# Patient Record
Sex: Male | Born: 1950 | Race: White | Hispanic: No | Marital: Married | Smoking: Never smoker
Health system: Southern US, Community
[De-identification: ages and names within clinical notes are randomized; demographics above are authoritative.]

## PROBLEM LIST (undated history)

## (undated) DIAGNOSIS — R011 Cardiac murmur, unspecified: Secondary | ICD-10-CM

## (undated) DIAGNOSIS — G4700 Insomnia, unspecified: Secondary | ICD-10-CM

## (undated) DIAGNOSIS — Q233 Congenital mitral insufficiency: Secondary | ICD-10-CM

## (undated) DIAGNOSIS — M1990 Unspecified osteoarthritis, unspecified site: Secondary | ICD-10-CM

## (undated) DIAGNOSIS — Z9289 Personal history of other medical treatment: Secondary | ICD-10-CM

## (undated) DIAGNOSIS — G2581 Restless legs syndrome: Secondary | ICD-10-CM

## (undated) DIAGNOSIS — I2510 Atherosclerotic heart disease of native coronary artery without angina pectoris: Secondary | ICD-10-CM

## (undated) DIAGNOSIS — G5600 Carpal tunnel syndrome, unspecified upper limb: Secondary | ICD-10-CM

## (undated) DIAGNOSIS — G4730 Sleep apnea, unspecified: Secondary | ICD-10-CM

## (undated) DIAGNOSIS — I499 Cardiac arrhythmia, unspecified: Secondary | ICD-10-CM

## (undated) DIAGNOSIS — R0600 Dyspnea, unspecified: Secondary | ICD-10-CM

## (undated) DIAGNOSIS — G709 Myoneural disorder, unspecified: Secondary | ICD-10-CM

## (undated) HISTORY — PX: CARDIAC VALVE REPLACEMENT: SHX585

## (undated) HISTORY — PX: ABLATION: SHX5711

## (undated) HISTORY — PX: EYE SURGERY: SHX253

## (undated) HISTORY — PX: KNEE SURGERY: SHX244

## (undated) HISTORY — PX: OTHER SURGICAL HISTORY: SHX169

## (undated) HISTORY — PX: CARPAL TUNNEL RELEASE: SHX101

---

## 2018-10-12 HISTORY — PX: CARDIAC CATHETERIZATION: SHX172

## 2018-12-21 HISTORY — PX: MITRAL VALVE REPLACEMENT: SHX147

## 2019-01-18 HISTORY — PX: OTHER SURGICAL HISTORY: SHX169

## 2019-06-28 ENCOUNTER — Other Ambulatory Visit: Payer: Self-pay | Admitting: Orthopedic Surgery

## 2019-07-15 NOTE — Pre-Procedure Instructions (Signed)
Grant Hayden  07/15/2019      [place], [place] [address] [place]  [address] Phone: [phone] Fax: [phone]    Your procedure is scheduled on Dec.3  Report to [facility] entrance A at 5:30 A.M.  Call this number if you have problems the morning of surgery:  [phone]   Remember:  Do not eat  after midnight.  You may drink clear liquids until 4:30 a.m. [name] Kitchen  Clear liquids allowed are:                    Water, Juice (non-citric and without pulp), Carbonated beverages, Clear Tea, Black Coffee only, Plain Jell-O only, Gatorade and Plain Popsicles only   Please complete your PRE-SURGERY ENSURE that was provided to you by 4:30 A.M. the morning of surgery.  Please, if able, drink it in one setting. DO NOT SIP.    Take these medicines the morning of surgery with A SIP OF WATER :  None            7 days prior to surgery STOP taking any Aspirin (unless otherwise instructed by your surgeon), Aleve, Naproxen, Ibuprofen, Motrin, Advil, Goody's, BC's, all herbal medications, fish oil, and all vitamins.            Follow your surgeon's instructions on when to stop Aspirin.  If no instructions were given by your surgeon then you will need to call the office to get those instructions.                    Do not wear jewelry.  Do not wear lotions, powders, or perfumes, or deodorant.  Do not shave 48 hours prior to surgery.  Men may shave face and neck.  Do not bring valuables to the hospital.  [facility] is not responsible for any belongings or valuables.  Contacts, dentures or bridgework may not be worn into surgery.  Leave your suitcase in the car.  After surgery it may be brought to your room.  For patients admitted to the hospital, discharge time will be determined by your treatment team.  Patients discharged the day of surgery will not be allowed to drive home.    Special instructions:   [facility]- Preparing For Surgery  Before surgery, you can play an important role. Because skin is not sterile, your skin needs to be as free of germs as possible. You can reduce the number of germs on your skin by washing with CHG (chlorahexidine gluconate) Soap before surgery.  CHG is an antiseptic cleaner which kills germs and bonds with the skin to continue killing germs even after washing.    Oral Hygiene is also important to reduce your risk of infection.  Remember - BRUSH YOUR TEETH THE MORNING OF SURGERY WITH YOUR REGULAR TOOTHPASTE  Please do not use if you have an allergy to CHG or antibacterial soaps. If your skin becomes reddened/irritated stop using the CHG.  Do not shave (including legs and underarms) for at least 48 hours prior to first CHG shower. It is OK to shave your face.  Please follow these instructions carefully.   1. Shower the NIGHT BEFORE SURGERY and the MORNING OF SURGERY with CHG.   2. If you chose to wash your hair, wash your hair first as usual with your normal shampoo.  3. After you shampoo, rinse your hair and body thoroughly  to remove the shampoo.  4. Use CHG as you would any other liquid soap. You can apply CHG directly to the skin and wash gently with a scrungie or a clean washcloth.   5. Apply the CHG Soap to your body ONLY FROM THE NECK DOWN.  Do not use on open wounds or open sores. Avoid contact with your eyes, ears, mouth and genitals (private parts). Wash Face and genitals (private parts)  with your normal soap.  6. Wash thoroughly, paying special attention to the area where your surgery will be performed.  7. Thoroughly rinse your body with warm water from the neck down.  8. DO NOT shower/wash with your normal soap after using and rinsing off the CHG Soap.  9. Pat yourself dry with a CLEAN TOWEL.  10. Wear CLEAN PAJAMAS to bed the night before surgery, wear comfortable clothes the morning of surgery  11. Place CLEAN SHEETS on your bed the night of  your first shower and DO NOT SLEEP WITH PETS.    Day of Surgery:  Do not apply any deodorants/lotions.  Please wear clean clothes to the hospital/surgery center.   Remember to brush your teeth WITH YOUR REGULAR TOOTHPASTE.    Please read over the following fact sheets that you were given. Coughing and Deep Breathing and Surgical Site Infection Prevention

## 2019-07-18 ENCOUNTER — Other Ambulatory Visit: Payer: Self-pay

## 2019-07-18 ENCOUNTER — Encounter (HOSPITAL_COMMUNITY)
Admission: RE | Admit: 2019-07-18 | Discharge: 2019-07-18 | Disposition: A | Discharge status: Home / Self Care | Payer: Medicare Other | Source: Ambulatory Visit | Attending: Orthopedic Surgery | Admitting: Orthopedic Surgery

## 2019-07-18 ENCOUNTER — Encounter (HOSPITAL_COMMUNITY): Payer: Self-pay

## 2019-07-18 ENCOUNTER — Other Ambulatory Visit (HOSPITAL_COMMUNITY)
Admission: RE | Admit: 2019-07-18 | Discharge: 2019-07-18 | Disposition: A | Discharge status: Home / Self Care | Payer: Medicare Other | Source: Ambulatory Visit | Attending: Orthopedic Surgery | Admitting: Orthopedic Surgery

## 2019-07-18 DIAGNOSIS — R011 Cardiac murmur, unspecified: Secondary | ICD-10-CM | POA: Diagnosis not present

## 2019-07-18 DIAGNOSIS — G2581 Restless legs syndrome: Secondary | ICD-10-CM | POA: Diagnosis not present

## 2019-07-18 DIAGNOSIS — Z01812 Encounter for preprocedural laboratory examination: Secondary | ICD-10-CM | POA: Insufficient documentation

## 2019-07-18 DIAGNOSIS — D649 Anemia, unspecified: Secondary | ICD-10-CM | POA: Insufficient documentation

## 2019-07-18 DIAGNOSIS — G629 Polyneuropathy, unspecified: Secondary | ICD-10-CM | POA: Diagnosis not present

## 2019-07-18 DIAGNOSIS — G9589 Other specified diseases of spinal cord: Secondary | ICD-10-CM | POA: Diagnosis not present

## 2019-07-18 DIAGNOSIS — Q233 Congenital mitral insufficiency: Secondary | ICD-10-CM | POA: Diagnosis not present

## 2019-07-18 DIAGNOSIS — I4891 Unspecified atrial fibrillation: Secondary | ICD-10-CM | POA: Diagnosis not present

## 2019-07-18 DIAGNOSIS — I4892 Unspecified atrial flutter: Secondary | ICD-10-CM | POA: Insufficient documentation

## 2019-07-18 DIAGNOSIS — Z79899 Other long term (current) drug therapy: Secondary | ICD-10-CM | POA: Diagnosis not present

## 2019-07-18 DIAGNOSIS — Z7982 Long term (current) use of aspirin: Secondary | ICD-10-CM | POA: Insufficient documentation

## 2019-07-18 HISTORY — DX: Myoneural disorder, unspecified: G70.9

## 2019-07-18 HISTORY — DX: Cardiac arrhythmia, unspecified: I49.9

## 2019-07-18 HISTORY — DX: Restless legs syndrome: G25.81

## 2019-07-18 HISTORY — DX: Cardiac murmur, unspecified: R01.1

## 2019-07-18 HISTORY — DX: Personal history of other medical treatment: Z92.89

## 2019-07-18 HISTORY — DX: Congenital mitral insufficiency: Q23.3

## 2019-07-18 HISTORY — DX: Insomnia, unspecified: G47.00

## 2019-07-18 HISTORY — DX: Carpal tunnel syndrome, unspecified upper limb: G56.00

## 2019-07-18 HISTORY — DX: Unspecified osteoarthritis, unspecified site: M19.90

## 2019-07-18 LAB — CBC WITH DIFFERENTIAL/PLATELET
Abs Immature Granulocytes: 0.01 10*3/uL (ref 0.00–0.07)
Basophils Absolute: 0.1 10*3/uL (ref 0.0–0.1)
Basophils Relative: 1 %
Eosinophils Absolute: 0.1 10*3/uL (ref 0.0–0.5)
Eosinophils Relative: 1 %
HCT: 45.1 % (ref 39.0–52.0)
Hemoglobin: 15 g/dL (ref 13.0–17.0)
Immature Granulocytes: 0 %
Lymphocytes Relative: 26 %
Lymphs Abs: 1.9 10*3/uL (ref 0.7–4.0)
MCH: 32.3 pg (ref 26.0–34.0)
MCHC: 33.3 g/dL (ref 30.0–36.0)
MCV: 97 fL (ref 80.0–100.0)
Monocytes Absolute: 0.9 10*3/uL (ref 0.1–1.0)
Monocytes Relative: 12 %
Neutro Abs: 4.5 10*3/uL (ref 1.7–7.7)
Neutrophils Relative %: 60 %
Platelets: 205 10*3/uL (ref 150–400)
RBC: 4.65 MIL/uL (ref 4.22–5.81)
RDW: 13.6 % (ref 11.5–15.5)
WBC: 7.4 10*3/uL (ref 4.0–10.5)
nRBC: 0 % (ref 0.0–0.2)

## 2019-07-18 LAB — COMPREHENSIVE METABOLIC PANEL
ALT: 28 U/L (ref 0–44)
AST: 35 U/L (ref 15–41)
Albumin: 3.9 g/dL (ref 3.5–5.0)
Alkaline Phosphatase: 79 U/L (ref 38–126)
Anion gap: 10 (ref 5–15)
BUN: 18 mg/dL (ref 8–23)
CO2: 25 mmol/L (ref 22–32)
Calcium: 9.7 mg/dL (ref 8.9–10.3)
Chloride: 102 mmol/L (ref 98–111)
Creatinine, Ser: 1.01 mg/dL (ref 0.61–1.24)
GFR calc Af Amer: >60 mL/min (ref >60)
GFR calc non Af Amer: >60 mL/min (ref >60)
Glucose, Bld: 87 mg/dL (ref 70–99)
Potassium: 4.2 mmol/L (ref 3.5–5.1)
Sodium: 137 mmol/L (ref 135–145)
Total Bilirubin: 0.7 mg/dL (ref 0.3–1.2)
Total Protein: 7.1 g/dL (ref 6.5–8.1)

## 2019-07-18 LAB — TYPE AND SCREEN
ABO/RH(D): O POS
Antibody Screen: NEGATIVE

## 2019-07-18 LAB — URINALYSIS, ROUTINE W REFLEX MICROSCOPIC
Bilirubin Urine: NEGATIVE
Glucose, UA: NEGATIVE mg/dL
Hgb urine dipstick: NEGATIVE
Ketones, ur: NEGATIVE mg/dL
Leukocytes,Ua: NEGATIVE
Nitrite: NEGATIVE
Protein, ur: NEGATIVE mg/dL
Specific Gravity, Urine: 1.011 (ref 1.005–1.030)
pH: 5 (ref 5.0–8.0)

## 2019-07-18 LAB — ABO/RH: ABO/RH(D): O POS

## 2019-07-18 LAB — SURGICAL PCR SCREEN
MRSA, PCR: NEGATIVE
Staphylococcus aureus: NEGATIVE

## 2019-07-18 LAB — APTT: aPTT: 42 seconds — ABNORMAL HIGH (ref 24–36)

## 2019-07-18 LAB — PROTIME-INR
INR: 1 (ref 0.8–1.2)
Prothrombin Time: 13.5 seconds (ref 11.4–15.2)

## 2019-07-18 NOTE — Progress Notes (Signed)
Patient denies shortness of breath, fever, cough and chest pain.  PCP - Dawayne Patricia, [facility] Cardiologist - Dr Nancy Fetter Rosaura Carpenter (see OV note 04/20/19)  Chest x-ray - 01/03/19 [facility] EKG - 02/02/19 [facility] Stress Test - denies ECHO - 04/20/19 [facility] Cardiac Cath - 10/12/18 [facility]  Aspirin Instructions: Last Dose 11/25/2  Anesthesia review: Yes  Coronavirus Screening Have you experienced the following symptoms:  Cough yes/no: No Fever (>100.46F)  yes/no: No Runny nose yes/no: No Sore throat yes/no: No Difficulty breathing/shortness of breath  yes/no: No  Have you traveled in the last 14 days and where? yes/no: No  Patient verbalized understanding of instructions that were given to them at the PAT appointment.

## 2019-07-19 ENCOUNTER — Encounter (HOSPITAL_COMMUNITY): Payer: Self-pay

## 2019-07-19 LAB — NOVEL CORONAVIRUS, NAA (HOSP ORDER, SEND-OUT TO REF LAB; TAT 18-24 HRS): SARS-CoV-2, NAA: NOT DETECTED

## 2019-07-19 NOTE — Anesthesia Preprocedure Evaluation (Addendum)
Anesthesia Evaluation  Patient identified by MRN, date of birth, ID band Patient awake    Reviewed: Allergy & Precautions, NPO status , Patient's Chart, lab work & pertinent test results  History of Anesthesia Complications Negative for: history of anesthetic complications  Airway Mallampati: II  TM Distance: >3 FB Neck ROM: Full    Dental no notable dental hx. (+) Dental Advisory Given   Pulmonary neg pulmonary ROS,    Pulmonary exam normal        Cardiovascular Normal cardiovascular exam+ dysrhythmias Atrial Fibrillation + Valvular Problems/Murmurs   S/P MVR  As needed CT surgery follow-up recommended at 02/09/19 visit. Last seen by cardiologist Dr. Maudie Mercury on 04/20/19. Right femoral groin seroma site appeared well healed. Maintaining SR following June DCCV, so Eliquis discontinued (ASA only). Echo showed EF 50-55% with normal bioprosthetic MV function. Toprol added for history mildly depressed LVF, post-op afib, and PVCs. Six month follow-up recommended. Last ASA 07/13/19 in anticipation for surgery.    Neuro/Psych negative psych ROS   GI/Hepatic negative GI ROS, Neg liver ROS,   Endo/Other  negative endocrine ROS  Renal/GU negative Renal ROS     Musculoskeletal negative musculoskeletal ROS (+)   Abdominal   Peds  Hematology negative hematology ROS (+)   Anesthesia Other Findings Day of surgery medications reviewed with the patient.  Reproductive/Obstetrics                            Anesthesia Physical Anesthesia Plan  ASA: III  Anesthesia Plan: General   Post-op Pain Management:    Induction: Intravenous  PONV Risk Score and Plan: 3 and Ondansetron, Dexamethasone and Midazolam  Airway Management Planned: Oral ETT  Additional Equipment:   Intra-op Plan:   Post-operative Plan: Extubation in OR  Informed Consent: I have reviewed the patients History and Physical, chart, labs and  discussed the procedure including the risks, benefits and alternatives for the proposed anesthesia with the patient or authorized representative who has indicated his/her understanding and acceptance.     Dental advisory given  Plan Discussed with: Anesthesiologist and CRNA  Anesthesia Plan Comments: (PAT note written 07/19/2019 by Myra Gianotti, PA-C. )      Anesthesia Quick Evaluation

## 2019-07-19 NOTE — Progress Notes (Signed)
Anesthesia Chart Review:  Case: [id number] Date/Time: 07/21/19 0715   Procedure: ANTERIOR CERVICAL DECOMPRESSION FUSION CERVICAL 5-6, CERVICAL 6-7 WITH INSTRUMENTATION AND ALLOGRAFT (N/A )   Anesthesia type: General   Pre-op diagnosis: PROGRESSIVE CERVICAL MYELOPATHY   Location: [facility] ROOM 05 / [facility] OR   Surgeon: Estill Bamberg, MD      DISCUSSION: Grant Hayden is a 68 year old male scheduled for the above procedure.  History includes never smoker, murmur/severe MR (s/p right mini-thoracotomy MV replacement with 33 mm Magna Ease bioprosthetic valve 12/21/18 with take-back for mediastinal chest exploration with repair of RV pacemaker wire site bleeding; epicardial wires cut at the level of the skin on 01/02/19), post-MVR anemia (s/p PRBC), post-MVR right groin seroma (at cannulation site; s/p I&D and placement of wound VAC 01/18/19), post-MVR afib/flutter (s/p failed DCCV 12/24/18, successful DCCV 01/18/19), RLS, neuropathy/cervical myelopathy (hands/feet).  Normal coronaries by 09/2018 cath.  As needed CT surgery follow-up recommended at 02/09/19 visit. Last seen by cardiologist Dr. Selena Batten on 04/20/19. Right femoral groin seroma site appeared well healed. Maintaining SR following June DCCV, so Eliquis discontinued (ASA only). Echo showed EF 50-55% with normal bioprosthetic MV function. Toprol added for history mildly depressed LVF, post-op afib, and PVCs. Six month follow-up recommended. Last ASA 07/13/19 in anticipation for surgery.   07/18/19 presurgical COVID-19 test in process. If negative and otherwise no acute changes then I would anticipate that he can proceed as planned.   VS: BP 118/89   Pulse 83   Temp 36.8 C (Oral)   Resp 18   Ht 6' (1.829 m)   Wt 81.4 kg   SpO2 100%   BMI 24.34 kg/m    PROVIDERS: Marinell Blight, PA-C is PCP  - Selena Batten Orma Render, MD is cardiologist [facility] Everywhere). Last visit 04/20/19 with six month follow-up planned.  Cain Sieve, MD is CT Surgeon [facility]  Everywhere). Last visit 02/09/19 with PRN follow-up recommended.    LABS: Preoperative labs noted. Results WNL except elevated aPTT at 42. (Comparison aPTT on 01/18/19 and 12/29/18 were WNL at [facility]). I called PTT result to Lupita Leash at Dr. Marshell Levan office and also sent him a message through [facility]. No plans for repeat preoperative PTT unless Dr. Yevette Edwards contacts me and says otherwise. (all labs ordered are listed, but only abnormal results are displayed)  Labs Reviewed  APTT - Abnormal; Notable for the following components:      Result Value   aPTT 42 (*)    All other components within normal limits  SURGICAL PCR SCREEN  CBC WITH DIFFERENTIAL/PLATELET  COMPREHENSIVE METABOLIC PANEL  PROTIME-INR  URINALYSIS, ROUTINE W REFLEX MICROSCOPIC  TYPE AND SCREEN     IMAGES: MRI Brain 06/13/19 ([facility]): Result Impression: Mild intracranial small vessel disease. Negative for acute intracranial pathology.  X-ray C-spine with Flexion/Extension 05/20/19 ([facility]): Result Impression: 1. There is 3.2 mm anterolisthesis at C7-T1 in the neutral position which remains stable with flexion but decreases slightly with extension. 2. Severe disc space narrowing and degenerative endplate osteophytes from C4-5 to C7-T1. 3. Bilateral bony foraminal narrowing as reported above. 4. Mild degenerative changes between the left lateral mass of C1 and C2 corresponding to the level of the effusion seen on MRI.  MRI C-spine 05/17/19 ([facility]): Results Impression: - Multilevel cervical spondylosis and mild spondylolisthesis. - At C6-C7 there is severe spinal canal and severe bilateral neural foraminal narrowing. No abnormal cord signal. - At C5-C6 there is mild spinal canal narrowing and severe bilateral  neural foraminal narrowing. - Other levels of lesser narrowing as above. - Large left atlantooccipital joint effusion suggestive of facet synovitis.   EKG: 03/07/19 [facility]):  Normal sinus  rhythm Left axis deviation RSR prime or QR pattern in V1 suggests right ventricular conduction delay Minimal voltage criteria for LVH, may be normal variant Abnormal ECG When compared with ECG of 10/13/2007: 4 3, premature ventricular complexes are no longer present, T wave inversion no longer evident in inferior leads. - Confirmed by Riccardo Dubin, MD   CV: Echo 04/20/19 ([facility]): Conclusions: Left ventricle: The left ventricular chamber size is mildly dilated. Left ventricular systolic function is at the lower limits of normal. The estimated ejection fraction is 50-55%. This is likely secondary to intraventricular conduction delay and septal dysynchrony status post open heart surgery. There is an E to A reversal in the mitral valve flow pattern suggestive of diastolic dysfunction. Aortic valve: There is trace aortic regurgitation. Mitral valve: There is mild mitral stenosis. Likely within normal limits given mitral valve replacement. There is a trace of mitral regurgitation. The bio-prosthetic mitral valve appears to be functioning normally.  Tricuspid valve: There is a trace tricuspid regurgitation.  Pulmonic valve: There is a trace pulmonic regurgitation.    Cardiac Event Monitor Result 04/13/19 [facility]): According to 04/20/19 note by Dr. Maudie Mercury, "Thirty day ambulatory ECG monitor demonstrated sinus rhythm with PVCs and nonsustained VT (longest 9 beats). Denies palpitations. We will start Grant Hayden on low-dose metoprolol given mildly depressed left ventricular systolic function, history of postoperative atrial flutter/fibrillation, and PVCs."    Cardiac cath 10/12/18 ([facility]): FINDINGS 1. Normal coronary arteries.  RECOMMENDATIONS 1. Continue evaluation for mitral valve prolapse with mitral regurgitation     Past Medical History:  Diagnosis Date  . Arthritis    hands, neck  . Carpal tunnel syndrome    bilateral  . Dysrhythmia    a-fib/a-flutter in post op after  12/21/18 MV replacement surgery, PVCs   . Heart murmur   . History of blood transfusion    [facility]  . Insomnia   . Mitral valve regurgitation congenital   . Neuromuscular disorder (HCC)    neuropathy hands/feet  . RLS (restless legs syndrome)     Past Surgical History:  Procedure Laterality Date  . CARDIAC CATHETERIZATION  10/12/2018  . finger fx Left    surgery  . KNEE SURGERY Right   . MITRAL VALVE REPLACEMENT  12/21/2018   right mini-thoracotomy for MVR using 33 mm Magna Ease bioprosthetic valve, Dr. Dwaine Deter, [facility]; had RV epidcarial pacemaker wire bleed requiring take-back and repair, so epicardial wires cut at level of skin (and not pulled) on 01/02/19  . tee guided cardioversion  01/18/2019    MEDICATIONS: . acetaminophen (TYLENOL) 500 MG tablet  . aspirin EC 81 MG tablet  . Cholecalciferol (VITAMIN D) 50 MCG (2000 UT) CAPS  . pramipexole (MIRAPEX) 1.5 MG tablet  . vitamin B-12 (CYANOCOBALAMIN) 1000 MCG tablet   No current facility-administered medications for this encounter.     Myra Gianotti, PA-C Surgical Short Stay/Anesthesiology [facility] Phone [phone] [facility] Phone [phone] 07/19/2019 2:48 PM

## 2019-07-21 ENCOUNTER — Observation Stay (HOSPITAL_COMMUNITY)
Admission: RE | Admit: 2019-07-21 | Discharge: 2019-07-22 | Disposition: A | Discharge status: Home / Self Care | Payer: Medicare Other | Attending: Orthopedic Surgery | Admitting: Orthopedic Surgery

## 2019-07-21 ENCOUNTER — Ambulatory Visit (HOSPITAL_COMMUNITY): Payer: Medicare Other

## 2019-07-21 ENCOUNTER — Encounter (HOSPITAL_COMMUNITY)
Admission: RE | Disposition: A | Discharge status: Home / Self Care | Payer: Self-pay | Source: Home / Self Care | Attending: Orthopedic Surgery

## 2019-07-21 ENCOUNTER — Ambulatory Visit (HOSPITAL_COMMUNITY): Payer: Medicare Other | Admitting: Vascular Surgery

## 2019-07-21 ENCOUNTER — Ambulatory Visit (HOSPITAL_COMMUNITY): Payer: Medicare Other | Admitting: Certified Registered"

## 2019-07-21 ENCOUNTER — Encounter (HOSPITAL_COMMUNITY): Payer: Self-pay

## 2019-07-21 ENCOUNTER — Other Ambulatory Visit: Payer: Self-pay

## 2019-07-21 DIAGNOSIS — Z7982 Long term (current) use of aspirin: Secondary | ICD-10-CM | POA: Insufficient documentation

## 2019-07-21 DIAGNOSIS — Z952 Presence of prosthetic heart valve: Secondary | ICD-10-CM | POA: Diagnosis not present

## 2019-07-21 DIAGNOSIS — G629 Polyneuropathy, unspecified: Secondary | ICD-10-CM | POA: Diagnosis not present

## 2019-07-21 DIAGNOSIS — M4802 Spinal stenosis, cervical region: Secondary | ICD-10-CM | POA: Diagnosis not present

## 2019-07-21 DIAGNOSIS — M4712 Other spondylosis with myelopathy, cervical region: Secondary | ICD-10-CM | POA: Diagnosis not present

## 2019-07-21 DIAGNOSIS — I4891 Unspecified atrial fibrillation: Secondary | ICD-10-CM | POA: Insufficient documentation

## 2019-07-21 DIAGNOSIS — Z419 Encounter for procedure for purposes other than remedying health state, unspecified: Secondary | ICD-10-CM

## 2019-07-21 DIAGNOSIS — G959 Disease of spinal cord, unspecified: Secondary | ICD-10-CM | POA: Diagnosis present

## 2019-07-21 DIAGNOSIS — M19041 Primary osteoarthritis, right hand: Secondary | ICD-10-CM | POA: Diagnosis not present

## 2019-07-21 DIAGNOSIS — G2581 Restless legs syndrome: Secondary | ICD-10-CM | POA: Diagnosis not present

## 2019-07-21 DIAGNOSIS — M19042 Primary osteoarthritis, left hand: Secondary | ICD-10-CM | POA: Diagnosis not present

## 2019-07-21 DIAGNOSIS — Z950 Presence of cardiac pacemaker: Secondary | ICD-10-CM | POA: Diagnosis not present

## 2019-07-21 DIAGNOSIS — Z79899 Other long term (current) drug therapy: Secondary | ICD-10-CM | POA: Insufficient documentation

## 2019-07-21 HISTORY — PX: ANTERIOR CERVICAL DECOMP/DISCECTOMY FUSION: SHX1161

## 2019-07-21 SURGERY — ANTERIOR CERVICAL DECOMPRESSION/DISCECTOMY FUSION 2 LEVELS
Anesthesia: General

## 2019-07-21 MED ORDER — CELECOXIB 200 MG PO CAPS
400.0000 mg | ORAL_CAPSULE | Freq: Once | ORAL | Status: AC
  Administered 2019-07-21: 06:00:00 400 mg via ORAL

## 2019-07-21 MED ORDER — SODIUM CHLORIDE 0.9 % IV SOLN: 250.0000 mL | INTRAVENOUS | Status: DC

## 2019-07-21 MED ORDER — EPHEDRINE SULFATE-NACL 50-0.9 MG/10ML-% IV SOSY
PREFILLED_SYRINGE | INTRAVENOUS | Status: DC | PRN
  Administered 2019-07-21 (×3): 10 mg via INTRAVENOUS

## 2019-07-21 MED ORDER — PANTOPRAZOLE SODIUM 40 MG IV SOLR: 40.0000 mg | Freq: Every day | INTRAVENOUS | Status: DC

## 2019-07-21 MED ORDER — PROMETHAZINE HCL 25 MG/ML IJ SOLN: 6.2500 mg | INTRAMUSCULAR | Status: DC | PRN

## 2019-07-21 MED ORDER — ROCURONIUM BROMIDE 10 MG/ML (PF) SYRINGE
PREFILLED_SYRINGE | INTRAVENOUS | Status: AC
  Filled 2019-07-21: qty 10

## 2019-07-21 MED ORDER — SODIUM CHLORIDE 0.9% FLUSH: 3.0000 mL | INTRAVENOUS | Status: DC | PRN

## 2019-07-21 MED ORDER — 0.9 % SODIUM CHLORIDE (POUR BTL) OPTIME
TOPICAL | Status: DC | PRN
  Administered 2019-07-21: 1000 mL

## 2019-07-21 MED ORDER — PHENYLEPHRINE HCL (PRESSORS) 10 MG/ML IV SOLN
INTRAVENOUS | Status: AC
  Filled 2019-07-21: qty 1

## 2019-07-21 MED ORDER — PROPOFOL 10 MG/ML IV BOLUS
INTRAVENOUS | Status: DC | PRN
  Administered 2019-07-21: 190 mg via INTRAVENOUS

## 2019-07-21 MED ORDER — ONDANSETRON HCL 4 MG/2ML IJ SOLN: 4.0000 mg | Freq: Four times a day (QID) | INTRAMUSCULAR | Status: DC | PRN

## 2019-07-21 MED ORDER — ACETAMINOPHEN 325 MG PO TABS: 650.0000 mg | ORAL_TABLET | ORAL | Status: DC | PRN

## 2019-07-21 MED ORDER — PRAMIPEXOLE DIHYDROCHLORIDE 0.25 MG PO TABS
1.5000 mg | ORAL_TABLET | Freq: Every day | ORAL | Status: DC
  Administered 2019-07-21: 1.5 mg via ORAL
  Filled 2019-07-21: qty 6

## 2019-07-21 MED ORDER — BUPIVACAINE HCL (PF) 0.25 % IJ SOLN
INTRAMUSCULAR | Status: AC
  Filled 2019-07-21: qty 30

## 2019-07-21 MED ORDER — MIDAZOLAM HCL 2 MG/2ML IJ SOLN
INTRAMUSCULAR | Status: DC | PRN
  Administered 2019-07-21: 2 mg via INTRAVENOUS

## 2019-07-21 MED ORDER — DEXAMETHASONE SODIUM PHOSPHATE 10 MG/ML IJ SOLN
INTRAMUSCULAR | Status: AC
  Filled 2019-07-21: qty 1

## 2019-07-21 MED ORDER — ACETAMINOPHEN 650 MG RE SUPP: 650.0000 mg | RECTAL | Status: DC | PRN

## 2019-07-21 MED ORDER — CEFAZOLIN SODIUM-DEXTROSE 2-4 GM/100ML-% IV SOLN
INTRAVENOUS | Status: AC
  Filled 2019-07-21: qty 100

## 2019-07-21 MED ORDER — LACTATED RINGERS IV SOLN
INTRAVENOUS | Status: DC | PRN
  Administered 2019-07-21 (×2): via INTRAVENOUS

## 2019-07-21 MED ORDER — MENTHOL 3 MG MT LOZG
1.0000 | LOZENGE | OROMUCOSAL | Status: DC | PRN
  Filled 2019-07-21: qty 9

## 2019-07-21 MED ORDER — SUCCINYLCHOLINE CHLORIDE 200 MG/10ML IV SOSY
PREFILLED_SYRINGE | INTRAVENOUS | Status: AC
  Filled 2019-07-21: qty 10

## 2019-07-21 MED ORDER — POVIDONE-IODINE 7.5 % EX SOLN
Freq: Once | CUTANEOUS | Status: DC
  Filled 2019-07-21: qty 118

## 2019-07-21 MED ORDER — THROMBIN 20000 UNITS EX SOLR
CUTANEOUS | Status: DC | PRN
  Administered 2019-07-21: 20000 [IU] via TOPICAL

## 2019-07-21 MED ORDER — ACETAMINOPHEN 500 MG PO TABS
1000.0000 mg | ORAL_TABLET | Freq: Once | ORAL | Status: AC
  Administered 2019-07-21: 06:00:00 1000 mg via ORAL

## 2019-07-21 MED ORDER — PHENYLEPHRINE HCL-NACL 10-0.9 MG/250ML-% IV SOLN
INTRAVENOUS | Status: DC | PRN
  Administered 2019-07-21: 30 ug/min via INTRAVENOUS

## 2019-07-21 MED ORDER — EPHEDRINE 5 MG/ML INJ
INTRAVENOUS | Status: AC
  Filled 2019-07-21: qty 10

## 2019-07-21 MED ORDER — CEFAZOLIN SODIUM-DEXTROSE 2-4 GM/100ML-% IV SOLN
2.0000 g | Freq: Three times a day (TID) | INTRAVENOUS | Status: AC
  Administered 2019-07-21 (×2): 2 g via INTRAVENOUS
  Filled 2019-07-21 (×2): qty 100

## 2019-07-21 MED ORDER — LIDOCAINE 2% (20 MG/ML) 5 ML SYRINGE
INTRAMUSCULAR | Status: DC | PRN
  Administered 2019-07-21: 100 mg via INTRAVENOUS

## 2019-07-21 MED ORDER — BUPIVACAINE HCL (PF) 0.25 % IJ SOLN
INTRAMUSCULAR | Status: DC | PRN
  Administered 2019-07-21: 5 mL

## 2019-07-21 MED ORDER — ONDANSETRON HCL 4 MG/2ML IJ SOLN
INTRAMUSCULAR | Status: AC
  Filled 2019-07-21: qty 2

## 2019-07-21 MED ORDER — VITAMIN D 25 MCG (1000 UNIT) PO TABS
2000.0000 [IU] | ORAL_TABLET | Freq: Every day | ORAL | Status: DC
  Administered 2019-07-21: 2000 [IU] via ORAL
  Filled 2019-07-21: qty 2

## 2019-07-21 MED ORDER — BISACODYL 5 MG PO TBEC: 5.0000 mg | DELAYED_RELEASE_TABLET | Freq: Every day | ORAL | Status: DC | PRN

## 2019-07-21 MED ORDER — LIDOCAINE 2% (20 MG/ML) 5 ML SYRINGE
INTRAMUSCULAR | Status: AC
  Filled 2019-07-21: qty 5

## 2019-07-21 MED ORDER — ACETAMINOPHEN 500 MG PO TABS
ORAL_TABLET | ORAL | Status: AC
  Administered 2019-07-21: 1000 mg via ORAL
  Filled 2019-07-21: qty 2

## 2019-07-21 MED ORDER — PHENOL 1.4 % MT LIQD: 1.0000 | OROMUCOSAL | Status: DC | PRN

## 2019-07-21 MED ORDER — PROPOFOL 10 MG/ML IV BOLUS
INTRAVENOUS | Status: AC
  Filled 2019-07-21: qty 20

## 2019-07-21 MED ORDER — FLEET ENEMA 7-19 GM/118ML RE ENEM: 1.0000 | ENEMA | Freq: Once | RECTAL | Status: DC | PRN

## 2019-07-21 MED ORDER — PHENYLEPHRINE 40 MCG/ML (10ML) SYRINGE FOR IV PUSH (FOR BLOOD PRESSURE SUPPORT)
PREFILLED_SYRINGE | INTRAVENOUS | Status: AC
  Filled 2019-07-21: qty 10

## 2019-07-21 MED ORDER — MIDAZOLAM HCL 2 MG/2ML IJ SOLN
INTRAMUSCULAR | Status: AC
  Filled 2019-07-21: qty 2

## 2019-07-21 MED ORDER — PHENYLEPHRINE 40 MCG/ML (10ML) SYRINGE FOR IV PUSH (FOR BLOOD PRESSURE SUPPORT)
PREFILLED_SYRINGE | INTRAVENOUS | Status: DC | PRN
  Administered 2019-07-21 (×2): 80 ug via INTRAVENOUS

## 2019-07-21 MED ORDER — ROCURONIUM BROMIDE 10 MG/ML (PF) SYRINGE
PREFILLED_SYRINGE | INTRAVENOUS | Status: DC | PRN
  Administered 2019-07-21: 20 mg via INTRAVENOUS
  Administered 2019-07-21: 80 mg via INTRAVENOUS

## 2019-07-21 MED ORDER — SENNOSIDES-DOCUSATE SODIUM 8.6-50 MG PO TABS: 1.0000 | ORAL_TABLET | Freq: Every evening | ORAL | Status: DC | PRN

## 2019-07-21 MED ORDER — CELECOXIB 200 MG PO CAPS
ORAL_CAPSULE | ORAL | Status: AC
  Administered 2019-07-21: 400 mg via ORAL
  Filled 2019-07-21: qty 2

## 2019-07-21 MED ORDER — OXYCODONE-ACETAMINOPHEN 5-325 MG PO TABS
1.0000 | ORAL_TABLET | ORAL | Status: DC | PRN
  Administered 2019-07-21 – 2019-07-22 (×3): 1 via ORAL
  Filled 2019-07-21 (×3): qty 1

## 2019-07-21 MED ORDER — FENTANYL CITRATE (PF) 250 MCG/5ML IJ SOLN
INTRAMUSCULAR | Status: AC
  Filled 2019-07-21: qty 5

## 2019-07-21 MED ORDER — FENTANYL CITRATE (PF) 250 MCG/5ML IJ SOLN
INTRAMUSCULAR | Status: DC | PRN
  Administered 2019-07-21: 50 ug via INTRAVENOUS
  Administered 2019-07-21: 100 ug via INTRAVENOUS

## 2019-07-21 MED ORDER — CEFAZOLIN SODIUM-DEXTROSE 2-4 GM/100ML-% IV SOLN
2.0000 g | INTRAVENOUS | Status: AC
  Administered 2019-07-21: 2 g via INTRAVENOUS

## 2019-07-21 MED ORDER — ONDANSETRON HCL 4 MG PO TABS: 4.0000 mg | ORAL_TABLET | Freq: Four times a day (QID) | ORAL | Status: DC | PRN

## 2019-07-21 MED ORDER — SODIUM CHLORIDE 0.9% FLUSH
3.0000 mL | Freq: Two times a day (BID) | INTRAVENOUS | Status: DC
  Administered 2019-07-21: 3 mL via INTRAVENOUS

## 2019-07-21 MED ORDER — FENTANYL CITRATE (PF) 100 MCG/2ML IJ SOLN: 25.0000 ug | INTRAMUSCULAR | Status: DC | PRN

## 2019-07-21 MED ORDER — SUGAMMADEX SODIUM 200 MG/2ML IV SOLN
INTRAVENOUS | Status: DC | PRN
  Administered 2019-07-21: 200 mg via INTRAVENOUS

## 2019-07-21 MED ORDER — THROMBIN (RECOMBINANT) 20000 UNITS EX SOLR
CUTANEOUS | Status: AC
  Filled 2019-07-21: qty 20000

## 2019-07-21 MED ORDER — PANTOPRAZOLE SODIUM 40 MG PO TBEC
40.0000 mg | DELAYED_RELEASE_TABLET | Freq: Every day | ORAL | Status: DC
  Administered 2019-07-21: 40 mg via ORAL
  Filled 2019-07-21: qty 1

## 2019-07-21 MED ORDER — DOCUSATE SODIUM 100 MG PO CAPS
100.0000 mg | ORAL_CAPSULE | Freq: Two times a day (BID) | ORAL | Status: DC
  Administered 2019-07-21 (×2): 100 mg via ORAL
  Filled 2019-07-21 (×2): qty 1

## 2019-07-21 MED ORDER — ONDANSETRON HCL 4 MG/2ML IJ SOLN
INTRAMUSCULAR | Status: DC | PRN
  Administered 2019-07-21: 4 mg via INTRAVENOUS

## 2019-07-21 MED ORDER — METHOCARBAMOL 1000 MG/10ML IJ SOLN: 500.0000 mg | Freq: Four times a day (QID) | INTRAVENOUS | Status: DC | PRN

## 2019-07-21 MED ORDER — METHOCARBAMOL 500 MG PO TABS
500.0000 mg | ORAL_TABLET | Freq: Four times a day (QID) | ORAL | Status: DC | PRN
  Administered 2019-07-21: 500 mg via ORAL
  Filled 2019-07-21: qty 1

## 2019-07-21 MED ORDER — ALUM & MAG HYDROXIDE-SIMETH 200-200-20 MG/5ML PO SUSP: 30.0000 mL | Freq: Four times a day (QID) | ORAL | Status: DC | PRN

## 2019-07-21 SURGICAL SUPPLY — 78 items
BENZOIN TINCTURE PRP APPL 2/3 (GAUZE/BANDAGES/DRESSINGS) ×3 IMPLANT
BIT DRILL NEURO 2X3.1 SFT TUCH (MISCELLANEOUS) ×1 IMPLANT
BIT DRILL SKYLINE 12MM (BIT) ×1 IMPLANT
BLADE CLIPPER SURG (BLADE) ×3 IMPLANT
BLADE SURG 15 STRL LF DISP TIS (BLADE) ×1 IMPLANT
BLADE SURG 15 STRL SS (BLADE) ×2
BONE VIVIGEN FORMABLE 1.3CC (Bone Implant) ×6 IMPLANT
BUR MATCHSTICK NEURO 3.0 LAGG (BURR) ×3 IMPLANT
CARTRIDGE OIL MAESTRO DRILL (MISCELLANEOUS) ×1 IMPLANT
CLOSURE STERI-STRIP 1/2X4 (GAUZE/BANDAGES/DRESSINGS) ×1
CLOSURE WOUND 1/2 X4 (GAUZE/BANDAGES/DRESSINGS) ×1
CLSR STERI-STRIP ANTIMIC 1/2X4 (GAUZE/BANDAGES/DRESSINGS) ×2 IMPLANT
COLLAR CERV LO CONTOUR FIRM DE (SOFTGOODS) IMPLANT
CORD BIPOLAR FORCEPS 12FT (ELECTRODE) ×3 IMPLANT
COVER SURGICAL LIGHT HANDLE (MISCELLANEOUS) ×3 IMPLANT
COVER WAND RF STERILE (DRAPES) ×3 IMPLANT
DECANTER SPIKE VIAL GLASS SM (MISCELLANEOUS) IMPLANT
DEVICE ENDSKLTN IMPLNT MED 5MM (Endomechanicals) ×1 IMPLANT
DEVICE ENDSKLTN IMPLNT MED 6MM (Orthopedic Implant) ×1 IMPLANT
DIFFUSER DRILL AIR PNEUMATIC (MISCELLANEOUS) ×3 IMPLANT
DRAIN JACKSON RD 7FR 3/32 (WOUND CARE) IMPLANT
DRAPE C-ARM 42X72 X-RAY (DRAPES) ×3 IMPLANT
DRAPE POUCH INSTRU U-SHP 10X18 (DRAPES) ×3 IMPLANT
DRAPE SURG 17X23 STRL (DRAPES) ×12 IMPLANT
DRILL BIT SKYLINE 12MM (BIT) ×2
DRILL NEURO 2X3.1 SOFT TOUCH (MISCELLANEOUS) ×3
DURAPREP 26ML APPLICATOR (WOUND CARE) ×3 IMPLANT
ELECT COATED BLADE 2.86 ST (ELECTRODE) ×3 IMPLANT
ELECT REM PT RETURN 9FT ADLT (ELECTROSURGICAL) ×3
ELECTRODE REM PT RTRN 9FT ADLT (ELECTROSURGICAL) ×1 IMPLANT
ENDOSKELETON IMPLANT MED 5MM (Endomechanicals) ×3 IMPLANT
ENDOSKELETON IMPLANT MED 6MM (Orthopedic Implant) ×3 IMPLANT
EVACUATOR SILICONE 100CC (DRAIN) IMPLANT
GAUZE 4X4 16PLY RFD (DISPOSABLE) ×3 IMPLANT
GAUZE SPONGE 4X4 12PLY STRL (GAUZE/BANDAGES/DRESSINGS) ×3 IMPLANT
GLOVE BIO SURGEON STRL SZ7 (GLOVE) ×3 IMPLANT
GLOVE BIO SURGEON STRL SZ8 (GLOVE) ×3 IMPLANT
GLOVE BIOGEL PI IND STRL 7.0 (GLOVE) ×2 IMPLANT
GLOVE BIOGEL PI IND STRL 8 (GLOVE) ×1 IMPLANT
GLOVE BIOGEL PI INDICATOR 7.0 (GLOVE) ×4
GLOVE BIOGEL PI INDICATOR 8 (GLOVE) ×2
GOWN STRL REUS W/ TWL LRG LVL3 (GOWN DISPOSABLE) ×1 IMPLANT
GOWN STRL REUS W/ TWL XL LVL3 (GOWN DISPOSABLE) ×1 IMPLANT
GOWN STRL REUS W/TWL LRG LVL3 (GOWN DISPOSABLE) ×2
GOWN STRL REUS W/TWL XL LVL3 (GOWN DISPOSABLE) ×2
IV CATH 14GX2 1/4 (CATHETERS) ×3 IMPLANT
KIT BASIN OR (CUSTOM PROCEDURE TRAY) ×3 IMPLANT
KIT TURNOVER KIT B (KITS) ×3 IMPLANT
MANIFOLD NEPTUNE II (INSTRUMENTS) ×3 IMPLANT
NEEDLE PRECISIONGLIDE 27X1.5 (NEEDLE) ×3 IMPLANT
NEEDLE SPNL 20GX3.5 QUINCKE YW (NEEDLE) ×3 IMPLANT
NS IRRIG 1000ML POUR BTL (IV SOLUTION) ×3 IMPLANT
OIL CARTRIDGE MAESTRO DRILL (MISCELLANEOUS) ×3
PACK ORTHO CERVICAL (CUSTOM PROCEDURE TRAY) IMPLANT
PAD ARMBOARD 7.5X6 YLW CONV (MISCELLANEOUS) ×6 IMPLANT
PATTIES SURGICAL .5 X.5 (GAUZE/BANDAGES/DRESSINGS) IMPLANT
PATTIES SURGICAL .5 X1 (DISPOSABLE) ×3 IMPLANT
PIN DISTRACTION 14 (PIN) ×6 IMPLANT
PLATE SKYLINE 2LVL 26MM (Plate) ×3 IMPLANT
POSITIONER HEAD DONUT 9IN (MISCELLANEOUS) ×3 IMPLANT
SCREW SKYLINE VAR OS 14MM (Screw) ×18 IMPLANT
SPONGE INTESTINAL PEANUT (DISPOSABLE) ×3 IMPLANT
SPONGE SURGIFOAM ABS GEL 100 (HEMOSTASIS) ×3 IMPLANT
STRIP CLOSURE SKIN 1/2X4 (GAUZE/BANDAGES/DRESSINGS) ×2 IMPLANT
SURGIFLO W/THROMBIN 8M KIT (HEMOSTASIS) IMPLANT
SUT MNCRL AB 4-0 PS2 18 (SUTURE) ×3 IMPLANT
SUT SILK 4 0 (SUTURE)
SUT SILK 4-0 18XBRD TIE 12 (SUTURE) IMPLANT
SUT VIC AB 2-0 CT2 18 VCP726D (SUTURE) ×3 IMPLANT
SYR BULB IRRIGATION 50ML (SYRINGE) ×3 IMPLANT
SYR CONTROL 10ML LL (SYRINGE) ×9 IMPLANT
TAPE CLOTH 4X10 WHT NS (GAUZE/BANDAGES/DRESSINGS) IMPLANT
TAPE CLOTH SURG 4X10 WHT LF (GAUZE/BANDAGES/DRESSINGS) ×3 IMPLANT
TAPE UMBILICAL COTTON 1/8X30 (MISCELLANEOUS) ×3 IMPLANT
TOWEL GREEN STERILE (TOWEL DISPOSABLE) ×3 IMPLANT
TOWEL GREEN STERILE FF (TOWEL DISPOSABLE) ×3 IMPLANT
WATER STERILE IRR 1000ML POUR (IV SOLUTION) ×3 IMPLANT
YANKAUER SUCT BULB TIP NO VENT (SUCTIONS) ×3 IMPLANT

## 2019-07-21 NOTE — Anesthesia Postprocedure Evaluation (Signed)
Anesthesia Post Note  Patient: Grant Hayden  Procedure(s) Performed: ANTERIOR CERVICAL DECOMPRESSION FUSION CERVICAL 5-6, CERVICAL 6-7 WITH INSTRUMENTATION AND ALLOGRAFT (N/A )     Patient location during evaluation: PACU Anesthesia Type: General Level of consciousness: sedated Pain management: pain level controlled Vital Signs Assessment: post-procedure vital signs reviewed and stable Respiratory status: spontaneous breathing and respiratory function stable Cardiovascular status: stable Postop Assessment: no apparent nausea or vomiting Anesthetic complications: no    Last Vitals:  Vitals:   07/21/19 1109 07/21/19 1126  BP: 104/74 120/80  Pulse: 78 78  Resp: (!) 23 18  Temp: 36.7 C 36.5 C  SpO2: 95% 99%    Last Pain:  Vitals:   07/21/19 1130  TempSrc:   PainSc: 0-No pain                 [name] DANIEL

## 2019-07-21 NOTE — H&P (Signed)
PREOPERATIVE H&P  Chief Complaint: Progressive balance deterioration  HPI: Grant Hayden is a 68 y.o. male who presents with ongoing deterioration in balance and deterioration in fine motor skills  MRI reveals cord compression and NF stenosis spanning C5-C7  Patient has failed multiple forms of conservative care and continues to have pain (see office notes for additional details regarding the patient's full course of treatment)  Past Medical History:  Diagnosis Date  . Arthritis    hands, neck  . Carpal tunnel syndrome    bilateral  . Dysrhythmia    a-fib/a-flutter in post op after 12/21/18 MV replacement surgery, PVCs   . Heart murmur   . History of blood transfusion    [facility]  . Insomnia   . Mitral valve regurgitation congenital   . Neuromuscular disorder (HCC)    neuropathy hands/feet  . RLS (restless legs syndrome)    Past Surgical History:  Procedure Laterality Date  . CARDIAC CATHETERIZATION  10/12/2018  . finger fx Left    surgery  . KNEE SURGERY Right   . MITRAL VALVE REPLACEMENT  12/21/2018   right mini-thoracotomy for MVR using 33 mm Magna Ease bioprosthetic valve, Dr. Dwaine Deter, [facility]; had RV epidcarial pacemaker wire bleed requiring take-back and repair, so epicardial wires cut at level of skin (and not pulled) on 01/02/19  . tee guided cardioversion  01/18/2019   Social History   Socioeconomic History  . Marital status: Married    Spouse name: Not on file  . Number of children: Not on file  . Years of education: Not on file  . Highest education level: Not on file  Occupational History  . Not on file  Social Needs  . Financial resource strain: Not on file  . Food insecurity    Worry: Not on file    Inability: Not on file  . Transportation needs    Medical: Not on file    Non-medical: Not on file  Tobacco Use  . Smoking status: Never Smoker  . Smokeless tobacco: Never Used  Substance and Sexual Activity  . Alcohol use: Yes   Alcohol/week: 3.0 - 4.0 standard drinks    Types: 3 - 4 Glasses of wine per week  . Drug use: Never  . Sexual activity: Not on file  Lifestyle  . Physical activity    Days per week: Not on file    Minutes per session: Not on file  . Stress: Not on file  Relationships  . Social Herbalist on phone: Not on file    Gets together: Not on file    Attends religious service: Not on file    Active member of club or organization: Not on file    Attends meetings of clubs or organizations: Not on file    Relationship status: Not on file  Other Topics Concern  . Not on file  Social History Narrative  . Not on file   History reviewed. No pertinent family history. No Known Allergies Prior to Admission medications   Medication Sig Start Date End Date Taking? Authorizing Provider  acetaminophen (TYLENOL) 500 MG tablet Take 1,000 mg by mouth at bedtime.   Yes [provider]  aspirin EC 81 MG tablet Take 81 mg by mouth at bedtime.   Yes [provider]  Cholecalciferol (VITAMIN D) 50 MCG (2000 UT) CAPS Take 2,000 Units by mouth daily.   Yes [provider]  pramipexole (MIRAPEX) 1.5 MG tablet Take 1.5  mg by mouth at bedtime.   Yes [provider]  vitamin B-12 (CYANOCOBALAMIN) 1000 MCG tablet Take 1,000 mcg by mouth daily.   Yes [provider]     All other systems have been reviewed and were otherwise negative with the exception of those mentioned in the HPI and as above.  Physical Exam: Vitals:   07/21/19 0601  BP: 119/82  Pulse: 68  Resp: 17  Temp: 97.6 F (36.4 C)  SpO2: 100%    Body mass index is 24.41 kg/m.  General: Alert, no acute distress Cardiovascular: No pedal edema Respiratory: No cyanosis, no use of accessory musculature Skin: No lesions in the area of chief complaint Neurologic: Sensation intact distally Psychiatric: Patient is competent for consent with normal mood and affect Lymphatic: No axillary or  cervical lymphadenopathy  MUSCULOSKELETAL: + hoffman's on the left  Assessment/Plan: PROGRESSIVE CERVICAL MYELOPATHY Plan for Procedure(s): ANTERIOR CERVICAL DECOMPRESSION FUSION CERVICAL 5-6, CERVICAL 6-7 WITH INSTRUMENTATION AND ALLOGRAFT   Jackelyn Hoehn, MD 07/21/2019 7:08 AM

## 2019-07-21 NOTE — Transfer of Care (Signed)
Immediate Anesthesia Transfer of Care Note  Patient: Grant Hayden  Procedure(s) Performed: ANTERIOR CERVICAL DECOMPRESSION FUSION CERVICAL 5-6, CERVICAL 6-7 WITH INSTRUMENTATION AND ALLOGRAFT (N/A )  Patient Location: PACU  Anesthesia Type:General  Level of Consciousness: awake, alert  and oriented  Airway & Oxygen Therapy: Patient Spontanous Breathing and Patient connected to face mask oxygen  Post-op Assessment: Report given to RN and Post -op Vital signs reviewed and stable  Post vital signs: Reviewed and stable  Last Vitals:  Vitals Value Taken Time  BP 105/69 07/21/19 1025  Temp    Pulse 75 07/21/19 1025  Resp 18 07/21/19 1025  SpO2 100 % 07/21/19 1025  Vitals shown include unvalidated device data.  Last Pain:  Vitals:   07/21/19 0613  TempSrc:   PainSc: 0-No pain         Complications: No apparent anesthesia complications

## 2019-07-21 NOTE — Anesthesia Procedure Notes (Signed)
Procedure Name: Intubation Date/Time: 07/21/2019 7:47 AM Performed by: Teressa Lower., CRNA Pre-anesthesia Checklist: Patient identified, Emergency Drugs available, Suction available and Patient being monitored Patient Re-evaluated:Patient Re-evaluated prior to induction Oxygen Delivery Method: Circle system utilized Preoxygenation: Pre-oxygenation with 100% oxygen Induction Type: IV induction Ventilation: Mask ventilation without difficulty Laryngoscope Size: Mac and 4 Tube type: Oral Tube size: 7.5 mm Number of attempts: 1 Airway Equipment and Method: Stylet Placement Confirmation: ETT inserted through vocal cords under direct vision,  positive ETCO2 and breath sounds checked- equal and bilateral Secured at: 24 cm Tube secured with: Tape Dental Injury: Teeth and Oropharynx as per pre-operative assessment

## 2019-07-21 NOTE — Op Note (Signed)
PATIENT NAME: Grant Hayden   MEDICAL RECORD NO.:   [id number]    DATE OF BIRTH: Jul 20, 1951    DATE OF PROCEDURE: 07/21/2019                               OPERATIVE REPORT     PREOPERATIVE DIAGNOSES: 1. Cervical myelopathy with possible cervical radiculopathy 2. Spinal stenosis spanning C5-C7, including spinal cord compression and neuroforaminal stenosis   POSTOPERATIVE DIAGNOSES: 1. Cervical myelopathy with possible cervical radiculopathy 2. Spinal stenosis spanning C5-C7, including spinal cord compression and neuroforaminal stenosis   PROCEDURE: 1. Anterior cervical decompression and fusion C5/6, C6/7. 2. Placement of anterior instrumentation, C5-C7. 3. Insertion of interbody device x 2 (Titan intervertebral spacers). 4. Intraoperative use of fluoroscopy. 5. Use of morselized allograft - ViviGen.   SURGEON:  Estill Bamberg, MD   ASSISTANT:  Jason Coop, PA-C.   ANESTHESIA:  General endotracheal anesthesia.   COMPLICATIONS:  None.   DISPOSITION:  Stable.   ESTIMATED BLOOD LOSS:  Minimal.   INDICATIONS FOR SURGERY:  Briefly, [name] is a pleasant 68 y.o. year- old patient, who did present to me with symptoms consistent with progressive cervical myelopathy.  He also has symptoms into his arms and hands consistent with possible radiculopathy. The patient's MRI did reveal the findings noted above.  Given the patient's ongoing and progressive symptoms, we did discuss proceeding with the procedure noted above.  The patient was fully aware of the risks and limitations of surgery as outlined in my preoperative note.   OPERATIVE DETAILS:  On 07/21/2019, the patient was brought to surgery and general endotracheal anesthesia was administered.  The patient was placed supine on the hospital bed. The neck was gently extended.  All bony prominences were meticulously padded.  The neck was prepped and draped in the usual sterile fashion.  At this point, I did make a left-sided  transverse incision.  The platysma was incised.  A Smith-Robinson approach was used and the anterior spine was identified. Extensive osteophytes were noted, and were removed.  A self-retaining retractor was placed.  I then subperiosteally exposed the vertebral bodies from C5-C7.  Caspar pins were then placed into the C6 and C7 vertebral bodies and distraction was applied.  A thorough and complete C6-7 intervertebral diskectomy was performed.  The posterior longitudinal ligament was identified and entered using a nerve hook.  I then used #1 followed by #2 Kerrison to perform a thorough and complete intervertebral diskectomy.  The spinal canal was thoroughly decompressed, as was the right and left neuroforamen.  Of note, the osteophytes posteriorly were extensive, and did require meticulous utilization of a series of curettes and a high-speed bur.  I was ultimately very pleased with the decompression. The endplates were then prepared and the appropriate-sized intervertebral spacer was then packed with ViviGen and tamped into position in the usual fashion.  The lower Caspar pin was then removed and placed into the C5 vertebral body and once again, distraction was applied across the C5-6 intervertebral space.  I then again performed a thorough and complete diskectomy, thoroughly decompressing the spinal canal and bilateral neuroforamena.   As noted at C6-7, the endplate was very irregular, with extensive osteophytes noted in the region of the spinal canal and right and left foramena.  I was pleased with the decompression at this level as well. After preparing the endplates, the appropriate-sized intervertebral spacer was packed with ViviGen and tamped into position.  The Caspar pins then were removed and bone wax was placed in their place.  The appropriate-sized anterior cervical plate was placed over the anterior spine.   Placement of the plate was rather meticulous, given the prominent osteophytes  noted at the margins of the intervertebral space, particularly lateral osteophytes just beneath the longus coli musculature.  I was however able to place the plate in a safe and appropriate location.  At this point, 14 mm variable angle screws were placed, 2 in each vertebral body from C5-C7 for a total of 6 vertebral body screws.  The screws were then locked to the plate using the Cam locking mechanism.  I was very pleased with the final fluoroscopic images.  The wound was then irrigated.  The wound was then explored for any undue bleeding and there was no bleeding noted. The wound was then closed in layers using 2-0 Vicryl, followed by 4-0 Monocryl.  Benzoin and Steri-Strips were applied, followed by sterile dressing.  All instrument counts were correct at the termination of the procedure.   Of note, Pricilla Holm, PA-C, was my assistant throughout surgery, and did aid in retraction, suctioning, and closure from start to finish.       Phylliss Bob, MD

## 2019-07-22 ENCOUNTER — Encounter (HOSPITAL_COMMUNITY): Payer: Self-pay | Admitting: Orthopedic Surgery

## 2019-07-22 DIAGNOSIS — M4712 Other spondylosis with myelopathy, cervical region: Secondary | ICD-10-CM | POA: Diagnosis not present

## 2019-07-22 MED ORDER — METHOCARBAMOL 500 MG PO TABS: 500.0000 mg | ORAL_TABLET | Freq: Four times a day (QID) | ORAL | 2 refills | Status: DC | PRN

## 2019-07-22 MED ORDER — OXYCODONE-ACETAMINOPHEN 5-325 MG PO TABS: 1.0000 | ORAL_TABLET | ORAL | 0 refills | Status: DC | PRN

## 2019-07-22 NOTE — Plan of Care (Signed)
Patient discharged home per MD order. Instructions given to patient and spouse with stated understanding of instructions given. Patient has an appointment with MD in 2 weeks

## 2019-07-22 NOTE — Progress Notes (Signed)
    Patient doing well  Denies arm pain Tolerating PO well Patient reports improvement in arm and leg symptoms   Physical Exam: Vitals:   07/22/19 0505 07/22/19 0520  BP: 121/81   Pulse: 76   Resp: 18   Temp:  98.1 F (36.7 C)  SpO2: 94%     Neck soft/supple Dressing in place NVI  POD #1 s/p ACDF, doing very well  - encourage ambulation - Percocet for pain, Valium for muscle spasms - d/c home today with f/u in 2 weeks

## 2019-08-03 NOTE — Discharge Summary (Signed)
Patient ID: Grant Hayden MRN: [id number] DOB/AGE: Nov 25, 1950 68 y.o.  Admit date: 07/21/2019 Discharge date: 07/22/2019  Admission Diagnoses:  Active Problems:   Myelopathy [facility])   Discharge Diagnoses:  Same  Past Medical History:  Diagnosis Date  . Arthritis    hands, neck  . Carpal tunnel syndrome    bilateral  . Dysrhythmia    a-fib/a-flutter in post op after 12/21/18 MV replacement surgery, PVCs   . Heart murmur   . History of blood transfusion    [facility]  . Insomnia   . Mitral valve regurgitation congenital   . Neuromuscular disorder (HCC)    neuropathy hands/feet  . RLS (restless legs syndrome)     Surgeries: Procedure(s): ANTERIOR CERVICAL DECOMPRESSION FUSION CERVICAL 5-6, CERVICAL 6-7 WITH INSTRUMENTATION AND ALLOGRAFT on 07/21/2019   Consultants: None  Discharged Condition: Improved  Hospital Course: Grant Hayden is an 68 y.o. male who was admitted 07/21/2019 for operative treatment of myelopathy. Patient has severe unremitting pain that affects sleep, daily activities, and work/hobbies. After pre-op clearance the patient was taken to the operating room on 07/21/2019 and underwent  Procedure(s): ANTERIOR CERVICAL DECOMPRESSION FUSION CERVICAL 5-6, CERVICAL 6-7 WITH INSTRUMENTATION AND ALLOGRAFT.    Patient was given perioperative antibiotics:  Anti-infectives (From admission, onward)   Start     Dose/Rate Route Frequency Ordered Stop   07/21/19 1400  ceFAZolin (ANCEF) IVPB 2g/100 mL premix     2 g 200 mL/hr over 30 Minutes Intravenous Every 8 hours 07/21/19 1131 07/21/19 2140   07/21/19 0600  ceFAZolin (ANCEF) IVPB 2g/100 mL premix     2 g 200 mL/hr over 30 Minutes Intravenous On call to O.R. 07/21/19 6789 07/21/19 0744       Patient was given sequential compression devices, early ambulation to prevent DVT.  Patient benefited maximally from hospital stay and there were no complications.    Recent vital signs: BP 121/72 (BP Location: Right Arm)    Pulse 85   Temp 98 F (36.7 C)   Resp 16   Ht 6' (1.829 m)   Wt 81.6 kg   SpO2 98%   BMI 24.41 kg/m    Discharge Medications:   Allergies as of 07/22/2019   No Known Allergies     Medication List    STOP taking these medications   acetaminophen 500 MG tablet Commonly known as: TYLENOL     TAKE these medications   methocarbamol 500 MG tablet Commonly known as: ROBAXIN Take 1 tablet (500 mg total) by mouth every 6 (six) hours as needed for muscle spasms.   oxyCODONE-acetaminophen 5-325 MG tablet Commonly known as: PERCOCET/ROXICET Take 1-2 tablets by mouth every 4 (four) hours as needed for moderate pain or severe pain.   pramipexole 1.5 MG tablet Commonly known as: MIRAPEX Take 1.5 mg by mouth at bedtime.   vitamin B-12 1000 MCG tablet Commonly known as: CYANOCOBALAMIN Take 1,000 mcg by mouth daily.   Vitamin D 50 MCG (2000 UT) Caps Take 2,000 Units by mouth daily.       Diagnostic Studies: DG Cervical Spine 1 View  Result Date: 07/21/2019 CLINICAL DATA:  Cervical spine surgery. EXAM: DG C-ARM 1-60 MIN; DG CERVICAL SPINE - 1 VIEW CONTRAST:  None. FLUOROSCOPY TIME:  Fluoroscopy Time:  0 minutes 10 seconds Number of Acquired Spot Images: 2 COMPARISON:  No prior. FINDINGS: C5 through C7 anterior and interbody fusion. Anatomic alignment. Visualized hardware intact. Diffuse degenerative change. IMPRESSION: C5 through C7 anterior and interbody fusion. Electronically  Signed   ByMaisie Fus  Register   On: 07/21/2019 10:11   DG C-Arm 1-60 Min  Result Date: 07/21/2019 CLINICAL DATA:  Cervical spine surgery. EXAM: DG C-ARM 1-60 MIN; DG CERVICAL SPINE - 1 VIEW CONTRAST:  None. FLUOROSCOPY TIME:  Fluoroscopy Time:  0 minutes 10 seconds Number of Acquired Spot Images: 2 COMPARISON:  No prior. FINDINGS: C5 through C7 anterior and interbody fusion. Anatomic alignment. Visualized hardware intact. Diffuse degenerative change. IMPRESSION: C5 through C7 anterior and interbody fusion.  Electronically Signed   By: Maisie Fus  Register   On: 07/21/2019 10:11    Disposition:    POD #1 s/p ACDF, doing very well  - encourage ambulation - Percocet for pain, Valium for muscle spasms -Scripts for pain sent to pharmacy electronically  -D/C instructions sheet printed and in chart -D/C today  -F/U in office 2 weeks   Signed: Eilene Ghazi [name] 08/03/2019, 1:28 PM

## 2019-09-08 ENCOUNTER — Ambulatory Visit: Payer: 59 | Attending: Internal Medicine

## 2019-09-08 DIAGNOSIS — Z23 Encounter for immunization: Secondary | ICD-10-CM | POA: Insufficient documentation

## 2019-09-08 NOTE — Progress Notes (Signed)
   Covid-19 Vaccination Clinic  Name:  Grant Hayden    MRN: [id number] DOB: [birth year]  09/08/2019  Mr. [name] was observed post Covid-19 immunization for 15 minutes without incidence. He was provided with Vaccine Information Sheet and instruction to access the V-Safe system.   Mr. [name] was instructed to call 911 with any severe reactions post vaccine: [name] Kitchen Difficulty breathing  . Swelling of your face and throat  . A fast heartbeat  . A bad rash all over your body  . Dizziness and weakness    Immunizations Administered    Name Date Dose VIS Date Route   Pfizer COVID-19 Vaccine 09/08/2019  3:10 PM 0.3 mL 07/29/2019 Intramuscular   Manufacturer: [employer], [employer]   Lot: [id number]   NDC: [address]-9

## 2019-09-29 ENCOUNTER — Ambulatory Visit: Payer: 59 | Attending: Internal Medicine

## 2019-09-29 DIAGNOSIS — Z23 Encounter for immunization: Secondary | ICD-10-CM

## 2019-09-29 NOTE — Progress Notes (Signed)
   Covid-19 Vaccination Clinic  Name:  Grant Hayden    MRN: [id number] DOB: 1951/04/18  09/29/2019  Grant Hayden was observed post Covid-19 immunization for 15 minutes without incidence. He was provided with Vaccine Information Sheet and instruction to access the V-Safe system.   Grant Hayden was instructed to call 911 with any severe reactions post vaccine: [name] Kitchen Difficulty breathing  . Swelling of your face and throat  . A fast heartbeat  . A bad rash all over your body  . Dizziness and weakness    Immunizations Administered    Name Date Dose VIS Date Route   Pfizer COVID-19 Vaccine 09/29/2019 12:32 PM 0.3 mL 07/29/2019 Intramuscular   Manufacturer: [employer], [employer]   Lot: [id number]   NDC: [address]-0

## 2021-05-07 IMAGING — RF DG C-ARM 1-60 MIN
1 series · 2 of 2 positions shown · IV contrast (agent unspecified)
Comparison: No prior.

CLINICAL DATA: Cervical spine surgery.

EXAM:
DG C-ARM 1-60 MIN; DG CERVICAL SPINE - 1 VIEW
CONTRAST:  None.
FLUOROSCOPY TIME:  Fluoroscopy Time:  0 minutes 10 seconds
Number of Acquired Spot Images: 2

[Series 1: run · 2 of 2 slices shown]
[im 1/2]
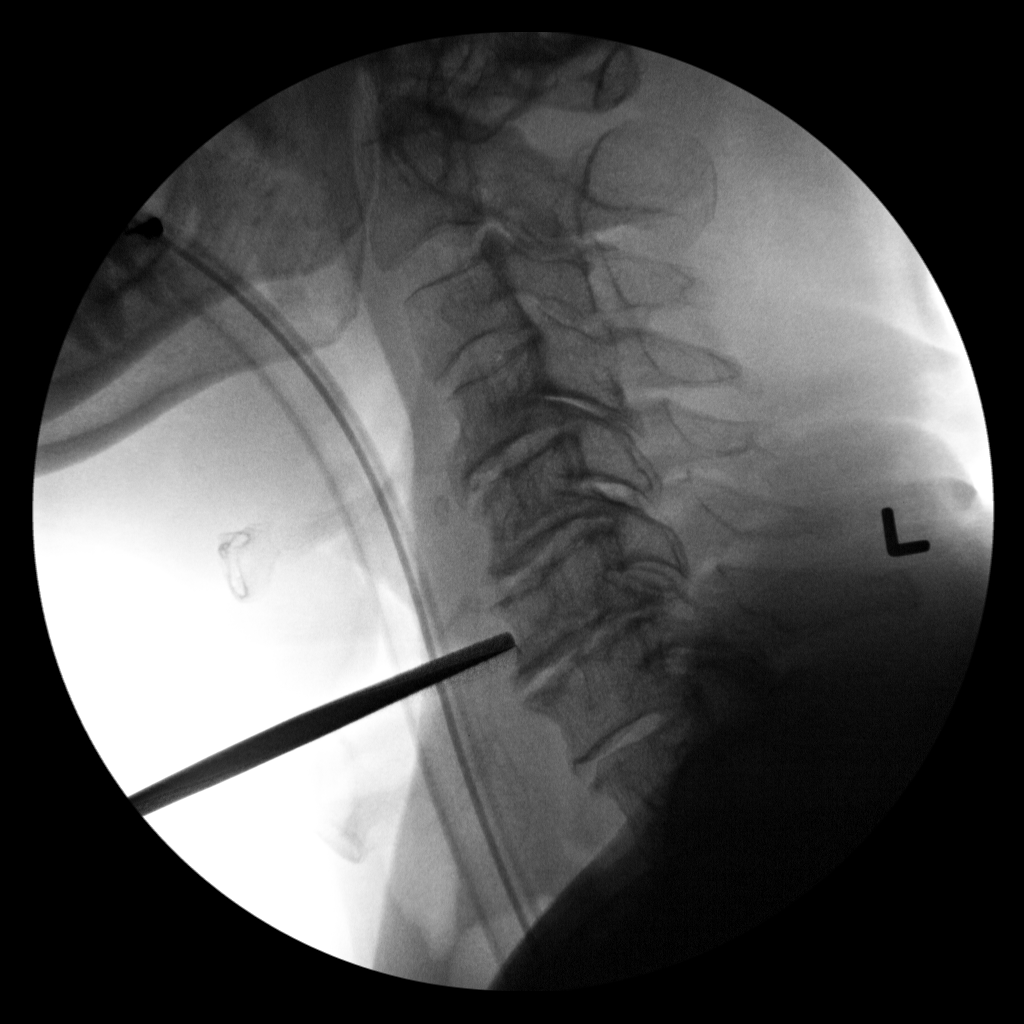
[im 2/2]
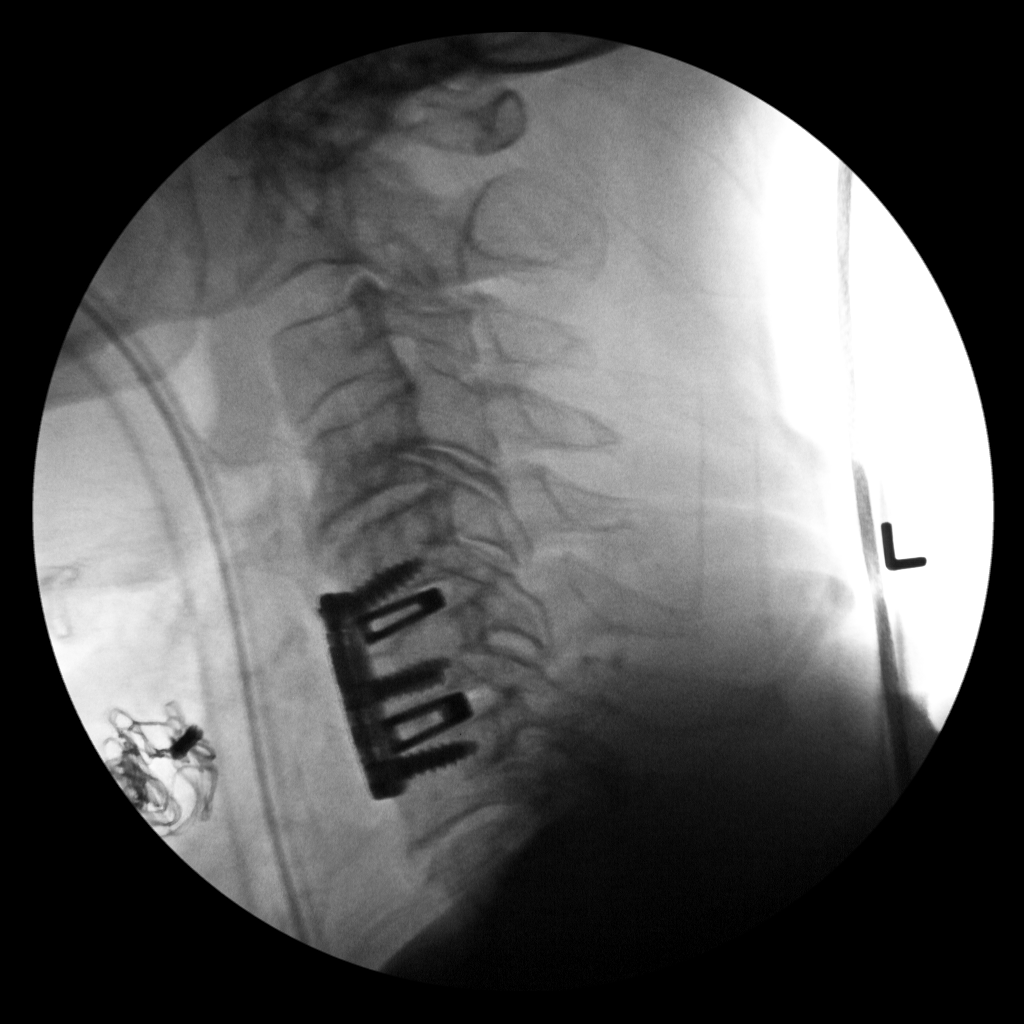

[2 of 2 positions shown; findings below may reference images not displayed]

FINDINGS: C5 through C7 anterior and interbody fusion. Anatomic alignment.
Visualized hardware intact. Diffuse degenerative change.
IMPRESSION: C5 through C7 anterior and interbody fusion.

## 2022-01-20 ENCOUNTER — Other Ambulatory Visit: Payer: Self-pay | Admitting: Orthopedic Surgery

## 2022-02-05 NOTE — Progress Notes (Signed)
Surgical Instructions    Your procedure is scheduled on Wednesday 02/12/22.  Report to [facility] Main Entrance "A" at 5:30 A.M., then check in with the Admitting office.  Call this number if you have problems the morning of surgery:  [phone]   If you have any questions prior to your surgery date call [phone]: Open Monday-Friday 8am-4pm    Remember:  Do not eat after midnight the night before your surgery  You may drink clear liquids until 4:30am the morning of your surgery.   Clear liquids allowed are: Water, Non-Citrus Juices (without pulp), Carbonated Beverages, Clear Tea, Black Coffee ONLY (NO MILK, CREAM OR POWDERED CREAMER of any kind), and Gatorade  Patient Instructions  The night before surgery:  No food after midnight. ONLY clear liquids after midnight  The day of surgery (if you do NOT have diabetes):  Drink ONE (1) Pre-Surgery Clear Ensure by 4:30am the morning of surgery. Drink in one sitting. Do not sip.  This drink was given to you during your hospital  pre-op appointment visit. Nothing else to drink after completing the  Pre-Surgery Clear Ensure.          If you have questions, please contact your surgeon's office.     Take these medicines the morning of surgery with A SIP OF WATER:  dofetilide (TIKOSYN)  IF NEEDED: albuterol (VENTOLIN HFA) inhaler- bring inhaler with you the day of surgery levalbuterol (XOPENEX HFA) inhaler- bring inhaler with you the day of surgery  As of today, STOP taking any (unless otherwise instructed by your surgeon) Aleve, Naproxen, Ibuprofen, Motrin, Advil, Goody's, BC's, all herbal medications, fish oil, and all vitamins.  PLEASE FOLLOW YOUR CARDIOLOGISTS INSTRUCTIONS REGARDING PLAVIX AND ASPIRIN.        Do not wear jewelry or makeup Do not wear lotions, powders, perfumes/colognes, or deodorant. Do not shave 48 hours prior to surgery.  Men may shave face and neck. Do not bring valuables to the hospital. Do not wear  nail polish, gel polish, artificial nails, or any other type of covering on natural nails (fingers and toes) If you have artificial nails or gel coating that need to be removed by a nail salon, please have this removed prior to surgery. Artificial nails or gel coating may interfere with anesthesia's ability to adequately monitor your vital signs.  [facility] is not responsible for any belongings or valuables. .   Do NOT Smoke (Tobacco/Vaping)  24 hours prior to your procedure  If you use a CPAP at night, you may bring your mask for your overnight stay.   Contacts, glasses, hearing aids, dentures or partials may not be worn into surgery, please bring cases for these belongings   For patients admitted to the hospital, discharge time will be determined by your treatment team.   Patients discharged the day of surgery will not be allowed to drive home, and someone needs to stay with them for 24 hours.   SURGICAL WAITING ROOM VISITATION Patients having surgery or a procedure in a hospital may have two support people. Children under the age of 65 must have an adult with them who is not the patient. They may stay in the waiting area during the procedure and may switch out with other visitors. If the patient needs to stay at the hospital during part of their recovery, the visitor guidelines for inpatient rooms apply.  Please refer to the [facility] website for the visitor guidelines for Inpatients (after your surgery is over and you are  in a regular room).       Special instructions:    Oral Hygiene is also important to reduce your risk of infection.  Remember - BRUSH YOUR TEETH THE MORNING OF SURGERY WITH YOUR REGULAR TOOTHPASTE   [facility]- Preparing For Surgery  Before surgery, you can play an important role. Because skin is not sterile, your skin needs to be as free of germs as possible. You can reduce the number of germs on your skin by washing with CHG (chlorahexidine gluconate)  Soap before surgery.  CHG is an antiseptic cleaner which kills germs and bonds with the skin to continue killing germs even after washing.     Please do not use if you have an allergy to CHG or antibacterial soaps. If your skin becomes reddened/irritated stop using the CHG.  Do not shave (including legs and underarms) for at least 48 hours prior to first CHG shower. It is OK to shave your face.  Please follow these instructions carefully.     Shower the NIGHT BEFORE SURGERY and the MORNING OF SURGERY with CHG Soap.   If you chose to wash your hair, wash your hair first as usual with your normal shampoo. After you shampoo, rinse your hair and body thoroughly to remove the shampoo.  Then [employer] and genitals (private parts) with your normal soap and rinse thoroughly to remove soap.  After that Use CHG Soap as you would any other liquid soap. You can apply CHG directly to the skin and wash gently with a scrungie or a clean washcloth.   Apply the CHG Soap to your body ONLY FROM THE NECK DOWN.  Do not use on open wounds or open sores. Avoid contact with your eyes, ears, mouth and genitals (private parts). Wash Face and genitals (private parts)  with your normal soap.   Wash thoroughly, paying special attention to the area where your surgery will be performed.  Thoroughly rinse your body with warm water from the neck down.  DO NOT shower/wash with your normal soap after using and rinsing off the CHG Soap.  Pat yourself dry with a CLEAN TOWEL.  Wear CLEAN PAJAMAS to bed the night before surgery  Place CLEAN SHEETS on your bed the night before your surgery  DO NOT SLEEP WITH PETS.   Day of Surgery: Take a shower with CHG soap. Wear Clean/Comfortable clothing the morning of surgery Do not apply any deodorants/lotions.   Remember to brush your teeth WITH YOUR REGULAR TOOTHPASTE.    If you received a COVID test during your pre-op visit, it is requested that you wear a mask when out in  public, stay away from anyone that may not be feeling well, and notify your surgeon if you develop symptoms. If you have been in contact with anyone that has tested positive in the last 10 days, please notify your surgeon.    Please read over the following fact sheets that you were given.

## 2022-02-06 ENCOUNTER — Other Ambulatory Visit: Payer: Self-pay

## 2022-02-06 ENCOUNTER — Encounter (HOSPITAL_COMMUNITY): Payer: Self-pay

## 2022-02-06 ENCOUNTER — Encounter (HOSPITAL_COMMUNITY)
Admission: RE | Admit: 2022-02-06 | Discharge: 2022-02-06 | Disposition: A | Discharge status: Home / Self Care | Payer: Medicare PPO | Source: Ambulatory Visit | Attending: Orthopedic Surgery | Admitting: Orthopedic Surgery

## 2022-02-06 VITALS — BP 128/89 | HR 64 | Temp 97.7°F | Resp 17 | Ht 70.0 in | Wt 182.4 lb

## 2022-02-06 DIAGNOSIS — I97190 Other postprocedural cardiac functional disturbances following cardiac surgery: Secondary | ICD-10-CM | POA: Insufficient documentation

## 2022-02-06 DIAGNOSIS — Z953 Presence of xenogenic heart valve: Secondary | ICD-10-CM | POA: Insufficient documentation

## 2022-02-06 DIAGNOSIS — Y838 Other surgical procedures as the cause of abnormal reaction of the patient, or of later complication, without mention of misadventure at the time of the procedure: Secondary | ICD-10-CM | POA: Diagnosis not present

## 2022-02-06 DIAGNOSIS — Z9989 Dependence on other enabling machines and devices: Secondary | ICD-10-CM | POA: Diagnosis not present

## 2022-02-06 DIAGNOSIS — I4510 Unspecified right bundle-branch block: Secondary | ICD-10-CM | POA: Diagnosis not present

## 2022-02-06 DIAGNOSIS — Z01812 Encounter for preprocedural laboratory examination: Secondary | ICD-10-CM | POA: Insufficient documentation

## 2022-02-06 DIAGNOSIS — G4733 Obstructive sleep apnea (adult) (pediatric): Secondary | ICD-10-CM | POA: Diagnosis not present

## 2022-02-06 DIAGNOSIS — I5020 Unspecified systolic (congestive) heart failure: Secondary | ICD-10-CM | POA: Insufficient documentation

## 2022-02-06 DIAGNOSIS — I2510 Atherosclerotic heart disease of native coronary artery without angina pectoris: Secondary | ICD-10-CM

## 2022-02-06 DIAGNOSIS — I050 Rheumatic mitral stenosis: Secondary | ICD-10-CM | POA: Diagnosis not present

## 2022-02-06 DIAGNOSIS — Z7902 Long term (current) use of antithrombotics/antiplatelets: Secondary | ICD-10-CM | POA: Insufficient documentation

## 2022-02-06 DIAGNOSIS — Z95811 Presence of heart assist device: Secondary | ICD-10-CM | POA: Insufficient documentation

## 2022-02-06 DIAGNOSIS — Z01818 Encounter for other preprocedural examination: Secondary | ICD-10-CM

## 2022-02-06 DIAGNOSIS — I4891 Unspecified atrial fibrillation: Secondary | ICD-10-CM | POA: Insufficient documentation

## 2022-02-06 HISTORY — DX: Atherosclerotic heart disease of native coronary artery without angina pectoris: I25.10

## 2022-02-06 HISTORY — DX: Dyspnea, unspecified: R06.00

## 2022-02-06 HISTORY — DX: Sleep apnea, unspecified: G47.30

## 2022-02-06 LAB — CBC
HCT: 41.3 % (ref 39.0–52.0)
Hemoglobin: 14.5 g/dL (ref 13.0–17.0)
MCH: 34 pg (ref 26.0–34.0)
MCHC: 35.1 g/dL (ref 30.0–36.0)
MCV: 96.7 fL (ref 80.0–100.0)
Platelets: 188 10*3/uL (ref 150–400)
RBC: 4.27 MIL/uL (ref 4.22–5.81)
RDW: 12.9 % (ref 11.5–15.5)
WBC: 5.7 10*3/uL (ref 4.0–10.5)
nRBC: 0 % (ref 0.0–0.2)

## 2022-02-06 LAB — BASIC METABOLIC PANEL
Anion gap: 8 (ref 5–15)
BUN: 19 mg/dL (ref 8–23)
CO2: 26 mmol/L (ref 22–32)
Calcium: 9.7 mg/dL (ref 8.9–10.3)
Chloride: 103 mmol/L (ref 98–111)
Creatinine, Ser: 0.89 mg/dL (ref 0.61–1.24)
GFR, Estimated: >60 mL/min (ref >60)
Glucose, Bld: 98 mg/dL (ref 70–99)
Potassium: 4.2 mmol/L (ref 3.5–5.1)
Sodium: 137 mmol/L (ref 135–145)

## 2022-02-06 LAB — SURGICAL PCR SCREEN
MRSA, PCR: NEGATIVE
Staphylococcus aureus: NEGATIVE

## 2022-02-06 LAB — TYPE AND SCREEN
ABO/RH(D): O POS
Antibody Screen: NEGATIVE

## 2022-02-06 NOTE — Progress Notes (Signed)
PCP - Dr. Annie Sable Cardiologist - Dr. Oletta Lamas (Dr. Aleen Campi is cardiac surgeon)  PPM/ICD - Denies Device Orders - n/a Rep Notified - n/a  Chest x-ray - denies EKG - 12/02/2021 - EKG tracing requested Stress Test - 02/11/2021 ECHO - 04/04/2021 Cardiac Cath - 10/10/2018  Sleep Study - Yes. Pt thinks it was in 2022 CPAP - Wears nightly. Does not know pressure settings  No DM  Blood Thinner Instructions: Pt has discussed Plavix with cardiologist and Dr. Yevette Edwards office. He will take his last dose 6/23 Aspirin Instructions: Per patient, cardiologist wants pt to continue taking ASA. RN instructed him to call Dr. Yevette Edwards office to clarify if he needs to stop it prior to surgery  ERAS Protcol - Yes. Clear liquids until 0430 morning of surgery PRE-SURGERY Ensure or G2- Ensure given at PAT visit  COVID TEST- n/a   Anesthesia review: Yes. Pt has extensive cardiac history including cardiothoracic surgery. He currently has a loop recorder implanted as well.    Patient denies shortness of breath, fever, cough and chest pain at PAT appointment   All instructions explained to the patient, with a verbal understanding of the material. Patient agrees to go over the instructions while at home for a better understanding. Patient also instructed to self quarantine after being tested for COVID-19. The opportunity to ask questions was provided.

## 2022-02-07 NOTE — Anesthesia Preprocedure Evaluation (Addendum)
Anesthesia Evaluation  Patient identified by MRN, date of birth, ID band Patient awake    Reviewed: Allergy & Precautions, H&P , NPO status , Patient's Chart, lab work & pertinent test results  Airway Mallampati: II   Neck ROM: full    Dental   Pulmonary shortness of breath, sleep apnea ,    breath sounds clear to auscultation       Cardiovascular + Valvular Problems/Murmurs  Rhythm:regular Rate:Normal  S/p MVR 2020.    Neuro/Psych  Neuromuscular disease    GI/Hepatic   Endo/Other    Renal/GU      Musculoskeletal  (+) Arthritis ,   Abdominal   Peds  Hematology   Anesthesia Other Findings   Reproductive/Obstetrics                             Anesthesia Physical Anesthesia Plan  ASA: 3  Anesthesia Plan: General   Post-op Pain Management:    Induction: Intravenous  PONV Risk Score and Plan: 2 and Ondansetron, Dexamethasone and Treatment may vary due to age or medical condition  Airway Management Planned: Oral ETT  Additional Equipment: ClearSight  Intra-op Plan:   Post-operative Plan: Extubation in OR  Informed Consent: I have reviewed the patients History and Physical, chart, labs and discussed the procedure including the risks, benefits and alternatives for the proposed anesthesia with the patient or authorized representative who has indicated his/her understanding and acceptance.     Dental advisory given  Plan Discussed with: CRNA, Anesthesiologist and Surgeon  Anesthesia Plan Comments: (PAT note by Antionette Poles, PA-C: Follows with cardiology in [place] for history of atrial fibrillation s/p Watchman device (09/2021), presence of loop recorder, mitral valve replacement (12/2018), HFrEF.  Normal coronaries by cath 09/2018.  TEE on 12/02/2021 demonstrated mildly decreased LV systolic function with LVEF of 45-50%, stable watchman FLX device, no significant peri-device leak with no  device related thrombus, bioprosthetic mitral valve appears to function normally,  Last seen 01/14/2022.  Back surgery discussed.  Per note by Dr. Selena Batten, "Patient seen by ortho/spine for worsening foot drop and radiculopathy. He is having issues with ambulation and was recommended to undergo decompression surgery. Surgery was initially delayed due to necessity of DOAC (45 days post Watchman) followed by DAPT (up until 6 months post Watchman). Patient is now 3 months status post Watchman. Given his significant ambulation issues and decline in quality of life, I believe it is reasonable to proceed with his necessary surgery at this time and required brief hold of plavix. I have discussed this with Dr. Bernette Mayers who is in agreement. Recommend holding plavix for no longer than 7 days (prefer 5 days) prior to surgery. Following surgery, recommend restarting plavix as soon as it is deemed safe from post-surgical standpoint. Recommend uninterrupted continuation of aspirin."  Patient reports last dose Plavix 02/07/2022.  OSA on CPAP.  Preop labs reviewed, WNL.  EKG 12/02/2021 (copy on chart): Sinus bradycardia.  Rate 56.  Incomplete right bundle branch block.  Possible anteroseptal infarct, age undetermined.  TEE 12/02/2021 (Care Everywhere): [name] Kitchen Left Ventricle: The ejection fraction is mildly decreased. The estimated ejection fraction is 45-50%.  . Left Atrium: Watchman FLX device stable. No significant peri-device leak. No device related thrombus..  . Right Ventricle: Normal systolic function is visualized.  . Mitral Valve: Prosthesis type: bioprosthetic. The prosthetic mitral valve appears to be functioning normally.  . Pericardium: There is an insignificant pericardial effusion.  . Aorta: There  is no dilatation of the aortic root. There is evidence of grade I: no or minimal intimal thickening in the ascending aorta.  . Septum: No color doppler evidence of interatrial shunt. Bubble study was not done..  Exercise  nuclear stress 02/11/2021 (Care Everywhere): Probably normal Rest/Stress myocardial perfusion SPECT study demonstrating homogeneous radiotracer uptake without significant perfusion deficit. There was no significant reversible ischemia. Gated SPECT images showed mildly reduced ejection fraction.. Left ventricular wall motion is normal. This is a low-risk perfusion study, classified as intermediate risk because of mildly reduced calculated ejection fraction.   Cath 10/12/2018 (Care Everywhere, pre-MVR): FINDINGS  1. Normal coronary arteries.   RECOMMENDATIONS  1. Continue evaluation for mitral valve prolapse with mitral regurgitation  )       Anesthesia Quick Evaluation

## 2022-02-07 NOTE — Progress Notes (Signed)
Anesthesia Chart Review:  Follows with cardiology in [place] for history of atrial fibrillation s/p Watchman device (09/2021), presence of loop recorder, mitral valve replacement (12/2018), HFrEF.  Normal coronaries by cath 09/2018.  TEE on 12/02/2021 demonstrated mildly decreased LV systolic function with LVEF of 45-50%, stable watchman FLX device, no significant peri-device leak with no device related thrombus, bioprosthetic mitral valve appears to function normally,  Last seen 01/14/2022.  Back surgery discussed.  Per note by Dr. Selena Batten, "Patient seen by ortho/spine for worsening foot drop and radiculopathy. He is having issues with ambulation and was recommended to undergo decompression surgery. Surgery was initially delayed due to necessity of DOAC (45 days post Watchman) followed by DAPT (up until 6 months post Watchman). Patient is now 3 months status post Watchman. Given his significant ambulation issues and decline in quality of life, I believe it is reasonable to proceed with his necessary surgery at this time and required brief hold of plavix. I have discussed this with Dr. Bernette Mayers who is in agreement. Recommend holding plavix for no longer than 7 days (prefer 5 days) prior to surgery. Following surgery, recommend restarting plavix as soon as it is deemed safe from post-surgical standpoint. Recommend uninterrupted continuation of aspirin."  Patient reports last dose Plavix 02/07/2022.  OSA on CPAP.  Preop labs reviewed, WNL.  EKG 12/02/2021 (copy on chart): Sinus bradycardia.  Rate 56.  Incomplete right bundle branch block.  Possible anteroseptal infarct, age undetermined.  TEE 12/02/2021 (Care Everywhere):  Left Ventricle: The ejection fraction is mildly decreased. The estimated ejection fraction is 45-50%.   Left Atrium: Watchman FLX device stable. No significant peri-device leak. No device related thrombus..   Right Ventricle: Normal systolic function is visualized.   Mitral Valve: Prosthesis  type: bioprosthetic. The prosthetic mitral valve appears to be functioning normally.   Pericardium: There is an insignificant pericardial effusion.   Aorta: There is no dilatation of the aortic root. There is evidence of grade I: no or minimal intimal thickening in the ascending aorta.   Septum: No color doppler evidence of interatrial shunt. Bubble study was not done..  Exercise nuclear stress 02/11/2021 (Care Everywhere): Probably normal Rest/Stress myocardial perfusion SPECT study demonstrating homogeneous radiotracer uptake without significant perfusion deficit. There was no significant reversible ischemia. Gated SPECT images showed mildly reduced ejection fraction.. Left ventricular wall motion is normal. This is a low-risk perfusion study, classified as intermediate risk because of mildly reduced calculated ejection fraction.   Cath 10/12/2018 (Care Everywhere, pre-MVR): FINDINGS  1. Normal coronary arteries.   RECOMMENDATIONS  1. Continue evaluation for mitral valve prolapse with mitral regurgitation     [facility] Short Stay Center/Anesthesiology Phone [phone] 02/07/2022 1:24 PM

## 2022-02-12 ENCOUNTER — Encounter (HOSPITAL_COMMUNITY): Payer: Self-pay | Admitting: Orthopedic Surgery

## 2022-02-12 ENCOUNTER — Ambulatory Visit (HOSPITAL_COMMUNITY): Payer: Medicare PPO

## 2022-02-12 ENCOUNTER — Ambulatory Visit (HOSPITAL_BASED_OUTPATIENT_CLINIC_OR_DEPARTMENT_OTHER): Payer: Medicare PPO | Admitting: Certified Registered Nurse Anesthetist

## 2022-02-12 ENCOUNTER — Encounter (HOSPITAL_COMMUNITY)
Admission: RE | Disposition: A | Discharge status: Home Health | Payer: Self-pay | Source: Home / Self Care | Attending: Orthopedic Surgery

## 2022-02-12 ENCOUNTER — Observation Stay (HOSPITAL_COMMUNITY)
Admission: RE | Admit: 2022-02-12 | Discharge: 2022-02-14 | Disposition: A | Discharge status: Home Health | Payer: Medicare PPO | Attending: Orthopedic Surgery | Admitting: Orthopedic Surgery

## 2022-02-12 ENCOUNTER — Other Ambulatory Visit: Payer: Self-pay

## 2022-02-12 ENCOUNTER — Ambulatory Visit: Payer: Self-pay

## 2022-02-12 ENCOUNTER — Ambulatory Visit (HOSPITAL_COMMUNITY): Payer: Medicare PPO | Admitting: Physician Assistant

## 2022-02-12 DIAGNOSIS — Z79899 Other long term (current) drug therapy: Secondary | ICD-10-CM | POA: Diagnosis not present

## 2022-02-12 DIAGNOSIS — Z7982 Long term (current) use of aspirin: Secondary | ICD-10-CM | POA: Diagnosis not present

## 2022-02-12 DIAGNOSIS — M5137 Other intervertebral disc degeneration, lumbosacral region: Secondary | ICD-10-CM

## 2022-02-12 DIAGNOSIS — Z7902 Long term (current) use of antithrombotics/antiplatelets: Secondary | ICD-10-CM | POA: Diagnosis not present

## 2022-02-12 DIAGNOSIS — G4730 Sleep apnea, unspecified: Secondary | ICD-10-CM | POA: Diagnosis not present

## 2022-02-12 DIAGNOSIS — M4317 Spondylolisthesis, lumbosacral region: Secondary | ICD-10-CM | POA: Diagnosis not present

## 2022-02-12 DIAGNOSIS — M4807 Spinal stenosis, lumbosacral region: Secondary | ICD-10-CM

## 2022-02-12 DIAGNOSIS — M5416 Radiculopathy, lumbar region: Secondary | ICD-10-CM | POA: Diagnosis not present

## 2022-02-12 DIAGNOSIS — M48062 Spinal stenosis, lumbar region with neurogenic claudication: Principal | ICD-10-CM | POA: Insufficient documentation

## 2022-02-12 DIAGNOSIS — M4800 Spinal stenosis, site unspecified: Secondary | ICD-10-CM | POA: Diagnosis present

## 2022-02-12 DIAGNOSIS — M532X6 Spinal instabilities, lumbar region: Secondary | ICD-10-CM | POA: Diagnosis not present

## 2022-02-12 DIAGNOSIS — I2510 Atherosclerotic heart disease of native coronary artery without angina pectoris: Secondary | ICD-10-CM | POA: Insufficient documentation

## 2022-02-12 HISTORY — PX: TRANSFORAMINAL LUMBAR INTERBODY FUSION (TLIF) WITH PEDICLE SCREW FIXATION 4 LEVEL: SHX6144

## 2022-02-12 SURGERY — TRANSFORAMINAL LUMBAR INTERBODY FUSION (TLIF) WITH PEDICLE SCREW FIXATION 4 LEVEL
Anesthesia: General | Site: Spine Lumbar | Laterality: Right

## 2022-02-12 MED ORDER — TRANEXAMIC ACID-NACL 1000-0.7 MG/100ML-% IV SOLN
INTRAVENOUS | Status: DC | PRN
  Administered 2022-02-12: 1000 mg via INTRAVENOUS

## 2022-02-12 MED ORDER — SODIUM CHLORIDE 0.9 % IV SOLN: 250.0000 mL | INTRAVENOUS | Status: DC

## 2022-02-12 MED ORDER — POVIDONE-IODINE 7.5 % EX SOLN
Freq: Once | CUTANEOUS | Status: DC
  Filled 2022-02-12: qty 118

## 2022-02-12 MED ORDER — METHOCARBAMOL 500 MG PO TABS
500.0000 mg | ORAL_TABLET | Freq: Four times a day (QID) | ORAL | Status: DC | PRN
  Administered 2022-02-12 – 2022-02-14 (×4): 500 mg via ORAL
  Filled 2022-02-12 (×4): qty 1

## 2022-02-12 MED ORDER — PHENYLEPHRINE HCL-NACL 20-0.9 MG/250ML-% IV SOLN
INTRAVENOUS | Status: DC | PRN
  Administered 2022-02-12: 75 ug/min via INTRAVENOUS
  Administered 2022-02-12: 25 ug/min via INTRAVENOUS

## 2022-02-12 MED ORDER — HYDROCODONE-ACETAMINOPHEN 5-325 MG PO TABS
1.0000 | ORAL_TABLET | ORAL | Status: DC | PRN
  Administered 2022-02-13: 1 via ORAL
  Filled 2022-02-12: qty 1

## 2022-02-12 MED ORDER — THROMBIN 20000 UNITS EX SOLR
CUTANEOUS | Status: AC
  Filled 2022-02-12: qty 20000

## 2022-02-12 MED ORDER — BUPIVACAINE HCL (PF) 0.25 % IJ SOLN
INTRAMUSCULAR | Status: DC | PRN
  Administered 2022-02-12: 10 mL

## 2022-02-12 MED ORDER — BUPIVACAINE-EPINEPHRINE 0.25% -1:200000 IJ SOLN
INTRAMUSCULAR | Status: DC | PRN
  Administered 2022-02-12: 20 mL

## 2022-02-12 MED ORDER — ONDANSETRON HCL 4 MG/2ML IJ SOLN: 4.0000 mg | Freq: Four times a day (QID) | INTRAMUSCULAR | Status: DC | PRN

## 2022-02-12 MED ORDER — EPHEDRINE 5 MG/ML INJ
INTRAVENOUS | Status: AC
  Filled 2022-02-12: qty 5

## 2022-02-12 MED ORDER — FENTANYL CITRATE (PF) 100 MCG/2ML IJ SOLN
25.0000 ug | INTRAMUSCULAR | Status: DC | PRN
  Administered 2022-02-12: 50 ug via INTRAVENOUS

## 2022-02-12 MED ORDER — ROCURONIUM BROMIDE 10 MG/ML (PF) SYRINGE
PREFILLED_SYRINGE | INTRAVENOUS | Status: DC | PRN
  Administered 2022-02-12: 60 mg via INTRAVENOUS
  Administered 2022-02-12 (×2): 20 mg via INTRAVENOUS
  Administered 2022-02-12: 50 mg via INTRAVENOUS
  Administered 2022-02-12: 20 mg via INTRAVENOUS
  Administered 2022-02-12: 40 mg via INTRAVENOUS
  Administered 2022-02-12: 20 mg via INTRAVENOUS

## 2022-02-12 MED ORDER — PROPOFOL 10 MG/ML IV BOLUS
INTRAVENOUS | Status: DC | PRN
  Administered 2022-02-12: 150 mg via INTRAVENOUS

## 2022-02-12 MED ORDER — CEFAZOLIN SODIUM-DEXTROSE 2-4 GM/100ML-% IV SOLN
2.0000 g | INTRAVENOUS | Status: AC
  Administered 2022-02-12 (×2): 2 g via INTRAVENOUS

## 2022-02-12 MED ORDER — ACETAMINOPHEN 10 MG/ML IV SOLN
INTRAVENOUS | Status: DC | PRN
  Administered 2022-02-12: 1000 mg via INTRAVENOUS

## 2022-02-12 MED ORDER — MORPHINE SULFATE (PF) 2 MG/ML IV SOLN
1.0000 mg | INTRAVENOUS | Status: DC | PRN
  Administered 2022-02-12: 2 mg via INTRAVENOUS
  Filled 2022-02-12: qty 1

## 2022-02-12 MED ORDER — ALBUMIN HUMAN 5 % IV SOLN: INTRAVENOUS | Status: DC | PRN

## 2022-02-12 MED ORDER — OXYCODONE-ACETAMINOPHEN 5-325 MG PO TABS
1.0000 | ORAL_TABLET | ORAL | Status: DC | PRN
  Administered 2022-02-12 – 2022-02-14 (×9): 2 via ORAL
  Filled 2022-02-12 (×9): qty 2

## 2022-02-12 MED ORDER — LACTATED RINGERS IV SOLN: INTRAVENOUS | Status: DC

## 2022-02-12 MED ORDER — FENTANYL CITRATE (PF) 250 MCG/5ML IJ SOLN
INTRAMUSCULAR | Status: AC
  Filled 2022-02-12: qty 5

## 2022-02-12 MED ORDER — BUPIVACAINE-EPINEPHRINE (PF) 0.25% -1:200000 IJ SOLN
INTRAMUSCULAR | Status: AC
  Filled 2022-02-12: qty 30

## 2022-02-12 MED ORDER — CEFAZOLIN SODIUM-DEXTROSE 2-4 GM/100ML-% IV SOLN
2.0000 g | Freq: Three times a day (TID) | INTRAVENOUS | Status: AC
  Administered 2022-02-12 – 2022-02-13 (×2): 2 g via INTRAVENOUS
  Filled 2022-02-12 (×2): qty 100

## 2022-02-12 MED ORDER — MENTHOL 3 MG MT LOZG: 1.0000 | LOZENGE | OROMUCOSAL | Status: DC | PRN

## 2022-02-12 MED ORDER — FLEET ENEMA 7-19 GM/118ML RE ENEM: 1.0000 | ENEMA | Freq: Once | RECTAL | Status: DC | PRN

## 2022-02-12 MED ORDER — METHOCARBAMOL 1000 MG/10ML IJ SOLN
500.0000 mg | Freq: Four times a day (QID) | INTRAVENOUS | Status: DC | PRN
  Filled 2022-02-12: qty 5

## 2022-02-12 MED ORDER — POTASSIUM CHLORIDE IN NACL 20-0.9 MEQ/L-% IV SOLN
INTRAVENOUS | Status: DC
Start: 2022-02-12 — End: 2022-02-14
  Filled 2022-02-12: qty 1000

## 2022-02-12 MED ORDER — ZOLPIDEM TARTRATE 5 MG PO TABS: 5.0000 mg | ORAL_TABLET | Freq: Every evening | ORAL | Status: DC | PRN

## 2022-02-12 MED ORDER — ALBUTEROL SULFATE HFA 108 (90 BASE) MCG/ACT IN AERS
1.0000 | INHALATION_SPRAY | Freq: Four times a day (QID) | RESPIRATORY_TRACT | Status: DC | PRN
Start: 2022-02-12 — End: 2022-02-12

## 2022-02-12 MED ORDER — OXYCODONE HCL 5 MG/5ML PO SOLN: 5.0000 mg | Freq: Once | ORAL | Status: DC | PRN

## 2022-02-12 MED ORDER — PHENOL 1.4 % MT LIQD
1.0000 | OROMUCOSAL | Status: DC | PRN
Start: 2022-02-12 — End: 2022-02-14

## 2022-02-12 MED ORDER — CHLORHEXIDINE GLUCONATE 0.12 % MT SOLN: 15.0000 mL | Freq: Once | OROMUCOSAL | Status: AC

## 2022-02-12 MED ORDER — EPHEDRINE SULFATE-NACL 50-0.9 MG/10ML-% IV SOSY
PREFILLED_SYRINGE | INTRAVENOUS | Status: DC | PRN
  Administered 2022-02-12 (×6): 5 mg via INTRAVENOUS

## 2022-02-12 MED ORDER — LIDOCAINE 2% (20 MG/ML) 5 ML SYRINGE
INTRAMUSCULAR | Status: AC
  Filled 2022-02-12: qty 5

## 2022-02-12 MED ORDER — ORAL CARE MOUTH RINSE: 15.0000 mL | Freq: Once | OROMUCOSAL | Status: AC

## 2022-02-12 MED ORDER — BUPIVACAINE LIPOSOME 1.3 % IJ SUSP
INTRAMUSCULAR | Status: DC | PRN
  Administered 2022-02-12 (×2): 20 mL

## 2022-02-12 MED ORDER — ACETAMINOPHEN 10 MG/ML IV SOLN
INTRAVENOUS | Status: AC
Start: 2022-02-12 — End: ?
  Filled 2022-02-12: qty 100

## 2022-02-12 MED ORDER — ACETAMINOPHEN 325 MG PO TABS: 650.0000 mg | ORAL_TABLET | ORAL | Status: DC | PRN

## 2022-02-12 MED ORDER — ALBUTEROL SULFATE (2.5 MG/3ML) 0.083% IN NEBU: 2.5000 mg | INHALATION_SOLUTION | Freq: Four times a day (QID) | RESPIRATORY_TRACT | Status: DC | PRN

## 2022-02-12 MED ORDER — THROMBIN 20000 UNITS EX SOLR: CUTANEOUS | Status: DC | PRN

## 2022-02-12 MED ORDER — TRANEXAMIC ACID-NACL 1000-0.7 MG/100ML-% IV SOLN
INTRAVENOUS | Status: AC
  Filled 2022-02-12: qty 100

## 2022-02-12 MED ORDER — ONDANSETRON HCL 4 MG/2ML IJ SOLN
INTRAMUSCULAR | Status: DC | PRN
  Administered 2022-02-12: 4 mg via INTRAVENOUS

## 2022-02-12 MED ORDER — ONDANSETRON HCL 4 MG/2ML IJ SOLN
INTRAMUSCULAR | Status: AC
  Filled 2022-02-12: qty 2

## 2022-02-12 MED ORDER — CHLORHEXIDINE GLUCONATE 0.12 % MT SOLN
OROMUCOSAL | Status: AC
  Administered 2022-02-12: 15 mL via OROMUCOSAL
  Filled 2022-02-12: qty 15

## 2022-02-12 MED ORDER — FENTANYL CITRATE (PF) 100 MCG/2ML IJ SOLN
INTRAMUSCULAR | Status: AC
  Filled 2022-02-12: qty 2

## 2022-02-12 MED ORDER — PRAMIPEXOLE DIHYDROCHLORIDE 0.25 MG PO TABS
1.5000 mg | ORAL_TABLET | Freq: Every day | ORAL | Status: DC
  Administered 2022-02-12 – 2022-02-13 (×2): 1.5 mg via ORAL
  Filled 2022-02-12 (×2): qty 6

## 2022-02-12 MED ORDER — PROPOFOL 10 MG/ML IV BOLUS
INTRAVENOUS | Status: AC
  Filled 2022-02-12: qty 20

## 2022-02-12 MED ORDER — ACETAMINOPHEN 650 MG RE SUPP: 650.0000 mg | RECTAL | Status: DC | PRN

## 2022-02-12 MED ORDER — SUGAMMADEX SODIUM 200 MG/2ML IV SOLN
INTRAVENOUS | Status: DC | PRN
  Administered 2022-02-12: 200 mg via INTRAVENOUS

## 2022-02-12 MED ORDER — LEVALBUTEROL TARTRATE 45 MCG/ACT IN AERO: 2.0000 | INHALATION_SPRAY | RESPIRATORY_TRACT | Status: DC | PRN

## 2022-02-12 MED ORDER — DOCUSATE SODIUM 100 MG PO CAPS
100.0000 mg | ORAL_CAPSULE | Freq: Two times a day (BID) | ORAL | Status: DC
  Administered 2022-02-12 – 2022-02-14 (×4): 100 mg via ORAL
  Filled 2022-02-12 (×4): qty 1

## 2022-02-12 MED ORDER — FENTANYL CITRATE (PF) 250 MCG/5ML IJ SOLN
INTRAMUSCULAR | Status: DC | PRN
  Administered 2022-02-12 (×2): 50 ug via INTRAVENOUS
  Administered 2022-02-12: 100 ug via INTRAVENOUS
  Administered 2022-02-12: 50 ug via INTRAVENOUS

## 2022-02-12 MED ORDER — SURGIFLO WITH THROMBIN (HEMOSTATIC MATRIX KIT) OPTIME
TOPICAL | Status: DC | PRN
  Administered 2022-02-12: 1 via TOPICAL

## 2022-02-12 MED ORDER — SENNOSIDES-DOCUSATE SODIUM 8.6-50 MG PO TABS: 1.0000 | ORAL_TABLET | Freq: Every evening | ORAL | Status: DC | PRN

## 2022-02-12 MED ORDER — CEFAZOLIN SODIUM-DEXTROSE 2-4 GM/100ML-% IV SOLN
INTRAVENOUS | Status: AC
  Filled 2022-02-12: qty 100

## 2022-02-12 MED ORDER — THROMBIN 20000 UNITS EX SOLR
CUTANEOUS | Status: DC | PRN
  Administered 2022-02-12 (×2): 20000 [IU] via TOPICAL

## 2022-02-12 MED ORDER — BUPIVACAINE LIPOSOME 1.3 % IJ SUSP
INTRAMUSCULAR | Status: AC
Start: 2022-02-12 — End: ?
  Filled 2022-02-12: qty 20

## 2022-02-12 MED ORDER — THROMBIN 20000 UNITS EX SOLR
CUTANEOUS | Status: DC | PRN
  Administered 2022-02-12: 20000 [IU] via TOPICAL

## 2022-02-12 MED ORDER — BUPIVACAINE LIPOSOME 1.3 % IJ SUSP
INTRAMUSCULAR | Status: AC
  Filled 2022-02-12: qty 20

## 2022-02-12 MED ORDER — BUPIVACAINE HCL (PF) 0.25 % IJ SOLN
INTRAMUSCULAR | Status: AC
  Filled 2022-02-12: qty 30

## 2022-02-12 MED ORDER — MIDAZOLAM HCL 2 MG/2ML IJ SOLN
INTRAMUSCULAR | Status: AC
  Filled 2022-02-12: qty 2

## 2022-02-12 MED ORDER — SODIUM CHLORIDE 0.9% FLUSH: 3.0000 mL | INTRAVENOUS | Status: DC | PRN

## 2022-02-12 MED ORDER — BISACODYL 5 MG PO TBEC: 5.0000 mg | DELAYED_RELEASE_TABLET | Freq: Every day | ORAL | Status: DC | PRN

## 2022-02-12 MED ORDER — SODIUM CHLORIDE 0.9% FLUSH
3.0000 mL | Freq: Two times a day (BID) | INTRAVENOUS | Status: DC
Start: 2022-02-12 — End: 2022-02-14
  Administered 2022-02-12 – 2022-02-13 (×2): 3 mL via INTRAVENOUS

## 2022-02-12 MED ORDER — MIDAZOLAM HCL 2 MG/2ML IJ SOLN
INTRAMUSCULAR | Status: DC | PRN
  Administered 2022-02-12: 2 mg via INTRAVENOUS

## 2022-02-12 MED ORDER — HEMOSTATIC AGENTS (NO CHARGE) OPTIME
TOPICAL | Status: DC | PRN
  Administered 2022-02-12: 1 via TOPICAL

## 2022-02-12 MED ORDER — ROCURONIUM BROMIDE 10 MG/ML (PF) SYRINGE
PREFILLED_SYRINGE | INTRAVENOUS | Status: AC
  Filled 2022-02-12: qty 20

## 2022-02-12 MED ORDER — ONDANSETRON HCL 4 MG PO TABS
4.0000 mg | ORAL_TABLET | Freq: Four times a day (QID) | ORAL | Status: DC | PRN
  Administered 2022-02-13: 4 mg via ORAL
  Filled 2022-02-12: qty 1

## 2022-02-12 MED ORDER — LACTATED RINGERS IV SOLN: INTRAVENOUS | Status: DC | PRN

## 2022-02-12 MED ORDER — 0.9 % SODIUM CHLORIDE (POUR BTL) OPTIME
TOPICAL | Status: DC | PRN
  Administered 2022-02-12 (×3): 1000 mL

## 2022-02-12 MED ORDER — METHYLENE BLUE 1 % INJ SOLN
INTRAVENOUS | Status: AC
  Filled 2022-02-12: qty 10

## 2022-02-12 MED ORDER — OXYCODONE HCL 5 MG PO TABS: 5.0000 mg | ORAL_TABLET | Freq: Once | ORAL | Status: DC | PRN

## 2022-02-12 MED ORDER — ALUM & MAG HYDROXIDE-SIMETH 200-200-20 MG/5ML PO SUSP
30.0000 mL | Freq: Four times a day (QID) | ORAL | Status: DC | PRN
Start: 2022-02-12 — End: 2022-02-14

## 2022-02-12 MED ORDER — DOFETILIDE 250 MCG PO CAPS
250.0000 ug | ORAL_CAPSULE | Freq: Two times a day (BID) | ORAL | Status: DC
  Administered 2022-02-12 – 2022-02-14 (×4): 250 ug via ORAL
  Filled 2022-02-12 (×5): qty 1

## 2022-02-12 MED ORDER — LIDOCAINE 2% (20 MG/ML) 5 ML SYRINGE
INTRAMUSCULAR | Status: DC | PRN
  Administered 2022-02-12: 40 mg via INTRAVENOUS

## 2022-02-12 SURGICAL SUPPLY — 98 items
BAG COUNTER SPONGE SURGICOUNT (BAG) ×2 IMPLANT
BENZOIN TINCTURE PRP APPL 2/3 (GAUZE/BANDAGES/DRESSINGS) ×2 IMPLANT
BLADE CLIPPER SURG (BLADE) IMPLANT
BUR PRESCISION 1.7 ELITE (BURR) ×2 IMPLANT
BUR ROUND FLUTED 5 RND (BURR) ×3 IMPLANT
BUR ROUND PRECISION 4.0 (BURR) IMPLANT
BUR SABER RD CUTTING 3.0 (BURR) IMPLANT
CAGE SABLE 10X30 6-12 8D (Cage) ×3 IMPLANT
CANNULA GRAFT BNE VG PRE-FILL (Bone Implant) IMPLANT
CARTRIDGE OIL MAESTRO DRILL (MISCELLANEOUS) ×1 IMPLANT
CNTNR URN SCR LID CUP LEK RST (MISCELLANEOUS) ×1 IMPLANT
CONT SPEC 4OZ STRL OR WHT (MISCELLANEOUS) ×1
COVER MAYO STAND STRL (DRAPES) ×4 IMPLANT
COVER SURGICAL LIGHT HANDLE (MISCELLANEOUS) ×2 IMPLANT
DIFFUSER DRILL AIR PNEUMATIC (MISCELLANEOUS) ×2 IMPLANT
DISPENSER GRAFT BNE VG (MISCELLANEOUS) IMPLANT
DISPENSER VIVIGEN BONE GRAFT (MISCELLANEOUS) ×2 IMPLANT
DRAIN CHANNEL 15F RND FF W/TCR (WOUND CARE) ×1 IMPLANT
DRAPE C-ARM 42X72 X-RAY (DRAPES) ×3 IMPLANT
DRAPE C-ARMOR (DRAPES) ×1 IMPLANT
DRAPE POUCH INSTRU U-SHP 10X18 (DRAPES) ×2 IMPLANT
DRAPE SURG 17X23 STRL (DRAPES) ×8 IMPLANT
DURAPREP 26ML APPLICATOR (WOUND CARE) ×2 IMPLANT
ELECT BLADE 4.0 EZ CLEAN MEGAD (MISCELLANEOUS) ×2
ELECT CAUTERY BLADE 6.4 (BLADE) ×2 IMPLANT
ELECT REM PT RETURN 9FT ADLT (ELECTROSURGICAL) ×2
ELECTRODE BLDE 4.0 EZ CLN MEGD (MISCELLANEOUS) ×1 IMPLANT
ELECTRODE REM PT RTRN 9FT ADLT (ELECTROSURGICAL) ×1 IMPLANT
EVACUATOR SILICONE 100CC (DRAIN) ×1 IMPLANT
FILTER STRAW FLUID ASPIR (MISCELLANEOUS) ×2 IMPLANT
GAUZE 4X4 16PLY ~~LOC~~+RFID DBL (SPONGE) ×4 IMPLANT
GAUZE SPONGE 4X4 12PLY STRL (GAUZE/BANDAGES/DRESSINGS) ×2 IMPLANT
GLOVE BIO SURGEON STRL SZ7 (GLOVE) ×2 IMPLANT
GLOVE BIO SURGEON STRL SZ8 (GLOVE) ×2 IMPLANT
GLOVE BIOGEL PI IND STRL 7.0 (GLOVE) ×1 IMPLANT
GLOVE BIOGEL PI IND STRL 8 (GLOVE) ×1 IMPLANT
GLOVE BIOGEL PI INDICATOR 7.0 (GLOVE) ×1
GLOVE BIOGEL PI INDICATOR 8 (GLOVE) ×1
GLOVE SURG ENC MOIS LTX SZ6.5 (GLOVE) ×2 IMPLANT
GOWN STRL REUS W/ TWL LRG LVL3 (GOWN DISPOSABLE) ×2 IMPLANT
GOWN STRL REUS W/ TWL XL LVL3 (GOWN DISPOSABLE) ×1 IMPLANT
GOWN STRL REUS W/TWL LRG LVL3 (GOWN DISPOSABLE) ×4
GOWN STRL REUS W/TWL XL LVL3 (GOWN DISPOSABLE) ×2
GRAFT BONE CANNULA VIVIGEN 3 (Bone Implant) ×12 IMPLANT
IV CATH 14GX2 1/4 (CATHETERS) ×2 IMPLANT
KIT BASIN OR (CUSTOM PROCEDURE TRAY) ×2 IMPLANT
KIT POSITION SURG JACKSON T1 (MISCELLANEOUS) ×2 IMPLANT
KIT TURNOVER KIT B (KITS) ×2 IMPLANT
MARKER SKIN DUAL TIP RULER LAB (MISCELLANEOUS) ×5 IMPLANT
NDL 18GX1X1/2 (RX/OR ONLY) (NEEDLE) ×1 IMPLANT
NDL HYPO 25GX1X1/2 BEV (NEEDLE) ×1 IMPLANT
NDL SPNL 18GX3.5 QUINCKE PK (NEEDLE) ×2 IMPLANT
NEEDLE 18GX1X1/2 (RX/OR ONLY) (NEEDLE) ×2 IMPLANT
NEEDLE 22X1 1/2 (OR ONLY) (NEEDLE) ×4 IMPLANT
NEEDLE HYPO 25GX1X1/2 BEV (NEEDLE) ×2 IMPLANT
NEEDLE SPNL 18GX3.5 QUINCKE PK (NEEDLE) ×4 IMPLANT
NS IRRIG 1000ML POUR BTL (IV SOLUTION) ×2 IMPLANT
OIL CARTRIDGE MAESTRO DRILL (MISCELLANEOUS) ×2
PACK LAMINECTOMY ORTHO (CUSTOM PROCEDURE TRAY) ×2 IMPLANT
PACK UNIVERSAL I (CUSTOM PROCEDURE TRAY) ×2 IMPLANT
PAD ARMBOARD 7.5X6 YLW CONV (MISCELLANEOUS) ×4 IMPLANT
PATTIES SURGICAL .5 X.5 (GAUZE/BANDAGES/DRESSINGS) ×1 IMPLANT
PATTIES SURGICAL .5 X1 (DISPOSABLE) ×2 IMPLANT
PATTIES SURGICAL .5X1.5 (GAUZE/BANDAGES/DRESSINGS) ×2 IMPLANT
ROD EXPEDIUM PREBENT 95MM (Rod) ×2 IMPLANT
SCREW CORTICAL VIPER 7X35 (Screw) ×4 IMPLANT
SCREW SET SINGLE INNER (Screw) ×8 IMPLANT
SCREW VIPER 7X45MM (Screw) ×1 IMPLANT
SCREW VIPER 7X50MM (Screw) ×1 IMPLANT
SCREW VIPER CORT FIX 6.00X30 (Screw) ×1 IMPLANT
SCREW VIPER CORT FIX 6X35 (Screw) ×2 IMPLANT
SOL ANTI FOG 6CC (MISCELLANEOUS) IMPLANT
SOLUTION ANTI FOG 6CC (MISCELLANEOUS) ×1
SPONGE INTESTINAL PEANUT (DISPOSABLE) ×3 IMPLANT
SPONGE SURGIFOAM ABS GEL 100 (HEMOSTASIS) ×2 IMPLANT
SPONGE T-LAP 4X18 ~~LOC~~+RFID (SPONGE) ×2 IMPLANT
STRIP CLOSURE SKIN 1/2X4 (GAUZE/BANDAGES/DRESSINGS) ×4 IMPLANT
SURGIFLO W/THROMBIN 8M KIT (HEMOSTASIS) ×1 IMPLANT
SUT ETHILON 2 0 FS 18 (SUTURE) ×1 IMPLANT
SUT MNCRL AB 4-0 PS2 18 (SUTURE) ×2 IMPLANT
SUT VIC AB 0 CT1 18XCR BRD 8 (SUTURE) ×1 IMPLANT
SUT VIC AB 0 CT1 8-18 (SUTURE) ×1
SUT VIC AB 1 CT1 18XCR BRD 8 (SUTURE) ×1 IMPLANT
SUT VIC AB 1 CT1 8-18 (SUTURE) ×2
SUT VIC AB 2-0 CT2 18 VCP726D (SUTURE) ×3 IMPLANT
SYR 20ML LL LF (SYRINGE) ×4 IMPLANT
SYR BULB IRRIG 60ML STRL (SYRINGE) ×2 IMPLANT
SYR CONTROL 10ML LL (SYRINGE) ×4 IMPLANT
SYR TB 1ML LUER SLIP (SYRINGE) ×2 IMPLANT
TAP EXPEDIUM DL 4.35 (INSTRUMENTS) ×1 IMPLANT
TAP EXPEDIUM DL 5.0 (INSTRUMENTS) ×1 IMPLANT
TAP EXPEDIUM DL 6.0 (INSTRUMENTS) ×1 IMPLANT
TAP EXPEDIUM DL 7.0 (INSTRUMENTS) ×1
TAP EXPEDIUM DL 7X2 (INSTRUMENTS) IMPLANT
TAPE CLOTH SURG 4X10 WHT LF (GAUZE/BANDAGES/DRESSINGS) ×1 IMPLANT
TRAY FOLEY MTR SLVR 16FR STAT (SET/KITS/TRAYS/PACK) ×2 IMPLANT
WATER STERILE IRR 1000ML POUR (IV SOLUTION) ×2 IMPLANT
YANKAUER SUCT BULB TIP NO VENT (SUCTIONS) ×2 IMPLANT

## 2022-02-12 NOTE — Progress Notes (Signed)
Patient placed on auto titrate CPAP, using full face mask, for hours of sleep. Patient is familiar with equipment and procedure. Room air used.

## 2022-02-12 NOTE — H&P (Addendum)
PREOPERATIVE H&P  Chief Complaint: Right leg weakness and numbness  HPI: Grant Hayden is a 71 y.o. male who presents with progressive weakness and numbness in the right leg  MRI reveals severe stenosis and instability spanning L3-S1  Patient has failed multiple forms of conservative care and continues to have pain (see office notes for additional details regarding the patient's full course of treatment)  Past Medical History:  Diagnosis Date   Arthritis    hands, neck   Carpal tunnel syndrome    bilateral   Coronary artery disease    Dyspnea    Dysrhythmia    a-fib/a-flutter in post op after 12/21/18 MV replacement surgery, PVCs    Heart murmur    History of blood transfusion    [facility]   Insomnia    Mitral valve regurgitation congenital    Neuromuscular disorder (HCC)    neuropathy hands/feet   RLS (restless legs syndrome)    Sleep apnea    Past Surgical History:  Procedure Laterality Date   ABLATION     2022 x2   ANTERIOR CERVICAL DECOMP/DISCECTOMY FUSION N/A 07/21/2019   Procedure: ANTERIOR CERVICAL DECOMPRESSION FUSION CERVICAL 5-6, CERVICAL 6-7 WITH INSTRUMENTATION AND ALLOGRAFT;  Surgeon: Estill Bamberg, MD;  Location: [facility] OR;  Service: Orthopedics;  Laterality: N/A;   CARDIAC CATHETERIZATION  10/12/2018   CARDIAC VALVE REPLACEMENT     CARPAL TUNNEL RELEASE Bilateral    EYE SURGERY     bilateral cataracts   finger fx Left    surgery   KNEE SURGERY Right    MITRAL VALVE REPLACEMENT  12/21/2018   right mini-thoracotomy for MVR using 33 mm Magna Ease bioprosthetic valve, Dr. Cain Sieve, [facility]; had RV epidcarial pacemaker wire bleed requiring take-back and repair, so epicardial wires cut at level of skin (and not pulled) on 01/02/19   tee guided cardioversion  01/18/2019   Social History   Socioeconomic History   Marital status: Married    Spouse name: Not on file   Number of children: Not on file   Years of education: Not on file   Highest education  level: Not on file  Occupational History   Not on file  Tobacco Use   Smoking status: Never   Smokeless tobacco: Never  Vaping Use   Vaping Use: Never used  Substance and Sexual Activity   Alcohol use: Yes    Alcohol/week: 3.0 - 4.0 standard drinks of alcohol    Types: 3 - 4 Glasses of wine per week   Drug use: Never   Sexual activity: Not on file  Other Topics Concern   Not on file  Social History Narrative   Not on file   Social Determinants of Health   Financial Resource Strain: Not on file  Food Insecurity: Not on file  Transportation Needs: Not on file  Physical Activity: Not on file  Stress: Not on file  Social Connections: Not on file   History reviewed. No pertinent family history. No Known Allergies Prior to Admission medications   Medication Sig Start Date End Date Taking? Authorizing Provider  aspirin EC 81 MG tablet Take 81 mg by mouth every morning. 10/17/21 10/17/22 Yes [provider]  clopidogrel (PLAVIX) 75 MG tablet Take 75 mg by mouth every morning. 12/02/21  Yes [provider]  dofetilide (TIKOSYN) 250 MCG capsule Take 250 mcg by mouth 2 (two) times daily. 11/04/21  Yes [provider]  MAGNESIUM-ZINC PO Take 225 mg by mouth at bedtime.  With vitamin D3   Yes [provider]  pramipexole (MIRAPEX) 1.5 MG tablet Take 1.5 mg by mouth at bedtime.   Yes [provider]  albuterol (VENTOLIN HFA) 108 (90 Base) MCG/ACT inhaler Inhale into the lungs every 6 (six) hours as needed for wheezing or shortness of breath.    [provider]  levalbuterol Pauline Aus HFA) 45 MCG/ACT inhaler Inhale 2 puffs into the lungs every 4 (four) hours as needed for wheezing (Coughing).    [provider]  tadalafil (CIALIS) 5 MG tablet Take 5 mg by mouth daily as needed. 01/09/21   [provider]  zolpidem (AMBIEN) 10 MG tablet Take 5 mg by mouth at bedtime as needed for sleep.    [provider]     All  other systems have been reviewed and were otherwise negative with the exception of those mentioned in the HPI and as above.  Physical Exam: Vitals:   02/12/22 0555  BP: 124/81  Pulse: 70  Resp: 17  Temp: 98 F (36.7 C)  SpO2: 99%    Body mass index is 26.26 kg/m.  General: Alert, no acute distress Cardiovascular: No pedal edema Respiratory: No cyanosis, no use of accessory musculature Skin: No lesions in the area of chief complaint Neurologic: Sensation intact distally Psychiatric: Patient is competent for consent with normal mood and affect Lymphatic: No axillary or cervical lymphadenopathy  4/5 strength to dorsiflexion on the right   Assessment/Plan: Multilevel stenosis with a retrolisthesis noted at L3-4, L4-5, and L5-S1, with severe disc degeneration Plan for Procedure(s): RIGHT-SIDED TRANSFORAMINAL LUMBAR INTERBODY FUSION LUMBAR 3- LUMBAR 4, LUMBAR 4- LUMBAR 5, LUMBAR 5- SACRUM 1 WITH INSTRUMENTATION AND ALLOGRAFT   Jackelyn Hoehn, MD 02/12/2022 6:23 AM

## 2022-02-12 NOTE — Transfer of Care (Signed)
Immediate Anesthesia Transfer of Care Note  Patient: Grant Hayden  Procedure(s) Performed: RIGHT-SIDED TRANSFORAMINAL LUMBAR INTERBODY FUSION LUMBAR 3- LUMBAR 4, LUMBAR 4- LUMBAR 5, LUMBAR 5- SACRUM 1 WITH INSTRUMENTATION AND ALLOGRAFT (Right: Spine Lumbar)  Patient Location: PACU  Anesthesia Type:General  Level of Consciousness: awake, alert  and oriented  Airway & Oxygen Therapy: Patient Spontanous Breathing and Patient connected to nasal cannula oxygen  Post-op Assessment: Report given to RN, Post -op Vital signs reviewed and stable and Patient moving all extremities X 4  Post vital signs: Reviewed and stable  Last Vitals:  Vitals Value Taken Time  BP 108/75 02/12/22 1500  Temp    Pulse 78 02/12/22 1502  Resp 21 02/12/22 1502  SpO2 100 % 02/12/22 1502  Vitals shown include unvalidated device data.  Last Pain:  Vitals:   02/12/22 0559  TempSrc:   PainSc: 0-No pain      Patients Stated Pain Goal: 0 (02/12/22 0559)  Complications: No notable events documented.

## 2022-02-12 NOTE — Anesthesia Procedure Notes (Signed)
Procedure Name: Intubation Date/Time: 02/12/2022 7:47 AM  Performed by: Shary Decamp, CRNAPre-anesthesia Checklist: Patient identified, Patient being monitored, Timeout performed, Emergency Drugs available and Suction available Patient Re-evaluated:Patient Re-evaluated prior to induction Oxygen Delivery Method: Circle System Utilized Preoxygenation: Pre-oxygenation with 100% oxygen Induction Type: IV induction Ventilation: Mask ventilation without difficulty Laryngoscope Size: Miller and 3 Grade View: Grade I Tube type: Oral Tube size: 7.5 mm Number of attempts: 1 Airway Equipment and Method: Stylet Placement Confirmation: ETT inserted through vocal cords under direct vision, positive ETCO2 and breath sounds checked- equal and bilateral Secured at: 22 cm Tube secured with: Tape Dental Injury: Teeth and Oropharynx as per pre-operative assessment

## 2022-02-12 NOTE — Op Note (Signed)
PATIENT NAME: Grant Hayden   MEDICAL RECORD NO.:   [id number]    DATE OF BIRTH: [birth year]   DATE OF PROCEDURE: 02/12/2022                                 OPERATIVE REPORT     PREOPERATIVE DIAGNOSES: 1. Spinal stenosis L3-4, L4-5, L5/S1 (M48.062) 2. Right-sided lumbar and lumbosacral radiculopathy (M54.16) 3. L2-3, L3-4, L5/S1 rertrolisthesis (M53.2X6)   POSTOPERATIVE DIAGNOSES: 1. Spinal stenosis L3-4, L4-5, L5/S1 (M48.062) 2. Right-sided lumbar and lumbosacral radiculopathy (M54.16) 3. L2-3, L3-4, L5/S1 rertrolisthesis (M53.2X6)   PROCEDURES: 1. Lumbar decompression, L3-4, L4-5, L5/S1 including bilateral partial facetectomy, and bilateral lumbar decompression 2. Right-sided L3-4, L4-5, L5/S1 transforaminal lumbar interbody fusion. 3. Left-sided L3-4, L4-5, L5/S1 posterolateral fusion. 4. Insertion of interbody device x 3 (Globus expandable intervertebral spacers). 5. Placement of segmental posterior instrumentation L3, L4, L5, S1, bilaterally. 6. Use of local autograft. 7. Use of morselized allograft - ViviGen. 8. Intraoperative use of fluoroscopy.   SURGEON:  Phylliss Bob, MD.   ASSISTANTPricilla Holm, PA-C.   ANESTHESIA:  General endotracheal anesthesia.   COMPLICATIONS:  None.   DISPOSITION:  Stable.   ESTIMATED BLOOD LOSS:  1700cc with 1040cc re-transfused via Cell Saver   INDICATIONS FOR SURGERY:  Briefly, Grant Hayden is a pleasant 71 -year-old male who did present to me with progressive weakness and numbness in the right leg and foot. I did feel that the symptoms were secondary to the findings noted above.  The patient failed conservative care and did wish to proceed with the procedure noted above.    OPERATIVE DETAILS:  On 02/12/2022, the patient was brought to surgery and general endotracheal anesthesia was administered.  The patient was placed prone on a well-padded flat Jackson bed with a spinal frame.  Antibiotics were given and a time-out procedure was  performed. The back was prepped and draped in the usual fashion.  A midline incision was made overlying the L3-4, L4-5, and L5/S1 intervertebral spaces. The fascia was incised at the midline.  The paraspinal musculature was bluntly swept laterally.  Anatomic landmarks for the L3, L4, and L5 and S1 pedicles were exposed bilaterally, using a medial to lateral cortical trajectory technique.  On the left side, the posterolateral gutter and facet joints at L3-4, L4-5, and L5/S1 were decorticated and a combination of 6 mm and 7 mm screws of the appropriate length were placed at L3, L4,L5, and S1 pedicles on the left, and a 95-mm rod was placed and distraction was applied across the rod at each intervertebral level.  On the right side, the cannulated pedicle holes were filled with bone wax.  I then proceeded with the decompressive aspect of the procedure.  Starting at L5/S1, I did perform a laminotomy and a full facetectomy on the right.  I was able to thoroughly and entirely decompress the L5/S1 intervertebral space bilaterally, removing facet hypertrophy and ligamentum flavum hypertrophy.  There was significant neuroforaminal stenosis, particular in the region of the foramen in the region of the exiting L5 nerve, which was decompressed as well.  At this point, with an assistant holding medial retraction of the traversing right S1 nerve, I did perform a thorough and complete L5/S1 intervertebral discectomy.  The intervertebral space was then liberally packed with autograft from the decompression, as well as allograft in the form of ViviGen, as was the appropriately sized intervertebral spacer.  The spacer  was then expanded to approximately 7 mm in height.  I was very pleased with the final resting position and press-fit of the spacer. Distraction was then released on the contralateral left side.   I then turned my attention to the L4-5 intervertebral level. I did perform a laminotomy and a full facetectomy on the  right.  I was able to thoroughly and entirely decompress the L4-5 intervertebral space bilaterally, removing facet hypertrophy and ligamentum flavum hypertrophy. This did entirely decompress the L4-5 level bilaterally.  At this point, with an assistant holding medial retraction of the traversing right L5 nerve, I did perform a thorough and complete L4-5 intervertebral discectomy.  The intervertebral space was then liberally packed with autograft from the decompression, as well as allograft in the form of ViviGen, as was the appropriately sized intervertebral spacer.  The spacer was then expanded to approximately 7.6 mm in height.  I was very pleased with the final resting position and press-fit of the spacer. Distraction was then released on the contralateral left side.    I then turned my attention to the L3-4 level.  Once again, it was clearly evident that there was stenosis at the L3-4 level.  The stenosis was thoroughly and adequately decompressed by performing a bilateral partial facetectomy.  I was able to thoroughly decompress the intervertebral disc space at L3-4 bilaterally.  With an assistant holding medial retraction of the traversing right L4 nerve, I did perform an annulotomy at the posterolateral aspect of the L3-4 intervertebral space.  I then used a series of curettes and pituitary rongeurs to perform a thorough and complete intervertebral diskectomy.  The intervertebral space was then liberally packed with autograft as well as allograft in the form of ViviGen, as was the appropriate-sized intervertebral spacer.  The spacer was then tamped into position in the usual fashion, and expanded to approximately 7.2 mm in height.  I was very pleased with the press-fit of the spacer.    I then placed 6 mm screws on the left at L3, L4, L5, and S1.  A 95-mm rod was then placed and caps were placed. All 6 caps were then locked.  The wound was copiously irrigated with a total of approximately 3 L prior to  placing the bone graft.  Additional autograft and allograft were then packed into the posterolateral gutter on the right side at the L3-4, L4-5, and L5-S1 level to help aid in the success of the fusion. The wound was explored for any undue bleeding and there was no substantial bleeding encountered.  Gel-Foam was placed over the laminectomy site.  The wound was then closed in layers using #1 Vicryl followed by 2-0 Vicryl, followed by 4-0 Monocryl.  Benzoin and Steri-Strips were applied followed by sterile dressing.    Of note, there was constant oozing noted throughout the surgery, much more than what is typical.  I did make every attempt to minimize this, using bipolar electrocautery as well as Surgiflo, in addition to Gelfoam soaked with thrombin.  I did even give 1 dose of TXA, to help minimize the oozing, however it did persist throughout the case.  It was as if the patient was on Plavix, although he did discontinue his Plavix well prior to surgery.  Fortunately, we were able to retransfuse his red blood cells via Cell Saver.  A #15 deep Blake drain was placed prior to closure.     Of note, Grant Hayden was my assistant throughout surgery, and did aid in retraction,  suctioning, placement of the hardware, and closure.     Grant Bamberg, MD

## 2022-02-13 ENCOUNTER — Encounter (HOSPITAL_COMMUNITY): Payer: Self-pay | Admitting: Orthopedic Surgery

## 2022-02-13 DIAGNOSIS — M48062 Spinal stenosis, lumbar region with neurogenic claudication: Secondary | ICD-10-CM | POA: Diagnosis not present

## 2022-02-13 MED ORDER — METHOCARBAMOL 500 MG PO TABS: 500.0000 mg | ORAL_TABLET | Freq: Four times a day (QID) | ORAL | 2 refills | Status: AC | PRN

## 2022-02-13 MED ORDER — OXYCODONE-ACETAMINOPHEN 5-325 MG PO TABS: 1.0000 | ORAL_TABLET | ORAL | 0 refills | Status: AC | PRN

## 2022-02-13 NOTE — Progress Notes (Signed)
    Patient doing well  Patient denies leg pain Has been ambulating   Physical Exam: Vitals:   02/12/22 2100 02/13/22 0353  BP:  101/67  Pulse: 70 73  Resp: 18 18  Temp:  97.7 F (36.5 C)  SpO2: 99% 97%    Dressing in place Right DF weakness noted to be at baseline  Drain output 60 since surgery  POD #1 s/p lumbar decompression and fusion, doing well  - up with PT/OT, encourage ambulation - Percocet for pain, Robaxin for muscle spasms - likely d/c home today with f/u in 2 weeks  - will keep drain for now

## 2022-02-13 NOTE — Progress Notes (Signed)
Physical Therapy Treatment Patient Details Name: Grant Hayden MRN: [id number] DOB: 1951-06-01 Today's Date: 02/13/2022   History of Present Illness 71 y.o. male who presents with progressive weakness and numbness in the right leg. s/p R transforaminal lumbar interbody fusion L3-S1    PT Comments    Second PT session for stair training for possible discharge home today. Pt much more awake during session. Able to recall precautions. Wife assist for donning back brace. Pt min guard for bed mobility, transfers and ambulation in hallway. Provided stair training with min A for steadying. D/c plans remain appropriate at this time.    Recommendations for follow up therapy are one component of a multi-disciplinary discharge planning process, led by the attending physician.  Recommendations may be updated based on patient status, additional functional criteria and insurance authorization.  Follow Up Recommendations  Home health PT     Assistance Recommended at Discharge Intermittent Supervision/Assistance  Patient can return home with the following A little help with walking and/or transfers;A little help with bathing/dressing/bathroom;Assistance with cooking/housework;Assist for transportation;Help with stairs or ramp for entrance   Equipment Recommendations  Rolling walker (2 wheels)    Recommendations for Other Services OT consult     Precautions / Restrictions Precautions Precautions: Back Precaution Booklet Issued: Yes (comment) Precaution Comments: able to recall 2/3 precautions at end of session, J-P drain Required Braces or Orthoses: Spinal Brace Spinal Brace: Thoracolumbosacral orthotic;Applied in sitting position Restrictions Weight Bearing Restrictions: No     Mobility  Bed Mobility Overal bed mobility: Needs Assistance Bed Mobility: Rolling, Sidelying to Sit Rolling: Min guard Sidelying to sit: Min assist       General bed mobility comments: min guard and maximal cuing  for hip and shoulder moving together, good management of LE off bed min A for bringing trunk to upright    Transfers Overall transfer level: Needs assistance Equipment used: Rolling walker (2 wheels) Transfers: Sit to/from Stand Sit to Stand: Min guard           General transfer comment: min guard for power up and steadying, vc for hand placement    Ambulation/Gait Ambulation/Gait assistance: Min guard Gait Distance (Feet): 250 Feet Assistive device: Rolling walker (2 wheels) Gait Pattern/deviations: Decreased dorsiflexion - right, Step-through pattern, Step-to pattern, Antalgic, Shuffle Gait velocity: slowed Gait velocity interpretation: <1.31 ft/sec, indicative of household ambulator   General Gait Details: min guard for short distance ambulation, pt able to progress ambulation, appears to have a little increase in dorsiflexion with R sided swing phase   Stairs Stairs: Yes Stairs assistance: Min assist Stair Management: Backwards, Forwards, One rail Left Number of Stairs: 1 (x3) General stair comments: light min A for practicing sequencing with ascent/descent of steps, with increased effort able to lift R foot      Balance Overall balance assessment: Needs assistance Sitting-balance support: Feet supported, No upper extremity supported, Feet unsupported Sitting balance-Leahy Scale: Good     Standing balance support: Bilateral upper extremity supported, Single extremity supported, During functional activity Standing balance-Leahy Scale: Fair                              Cognition Arousal/Alertness: Awake/alert Behavior During Therapy: Flat affect Overall Cognitive Status: Impaired/Different from baseline Area of Impairment: Problem solving                             Problem Solving: Slow  processing General Comments: continues to have slowed processing           General Comments General comments (skin integrity, edema, etc.): Wife  with improved ability to assist in brace donning      Pertinent Vitals/Pain Pain Assessment Pain Assessment: Faces Pain Score: 2  Faces Pain Scale: Hurts little more Pain Location: back incision site Pain Descriptors / Indicators: Aching, Sore Pain Intervention(s): Limited activity within patient's tolerance, Monitored during session, Repositioned    Home Living Family/patient expects to be discharged to:: Private residence Living Arrangements: Spouse/significant other Available Help at Discharge: Family;Available 24 hours/day Type of Home: House Home Access: Stairs to enter Entrance Stairs-Rails: Left     Home Layout: One level Home Equipment: BSC/3in1;Rolling Walker (2 wheels);Toilet riser          PT Goals (current goals can now be found in the care plan section) Acute Rehab PT Goals Patient Stated Goal: walk daughter down the aisle this fall PT Goal Formulation: With patient/family Time For Goal Achievement: 02/28/22 Potential to Achieve Goals: Good Progress towards PT goals: Progressing toward goals    Frequency    Min 4X/week      PT Plan Current plan remains appropriate       AM-PAC PT "6 Clicks" Mobility   Outcome Measure  Help needed turning from your back to your side while in a flat bed without using bedrails?: None Help needed moving from lying on your back to sitting on the side of a flat bed without using bedrails?: A Little Help needed moving to and from a bed to a chair (including a wheelchair)?: A Little Help needed standing up from a chair using your arms (e.g., wheelchair or bedside chair)?: A Little Help needed to walk in hospital room?: A Little Help needed climbing 3-5 steps with a railing? : A Lot 6 Click Score: 18    End of Session Equipment Utilized During Treatment: Back brace Activity Tolerance: Patient tolerated treatment well Patient left: with family/visitor present;Other (comment) (in bathroom) Nurse Communication: Mobility  status;Other (comment) (need for custom AFO order) PT Visit Diagnosis: Unsteadiness on feet (R26.81);Other abnormalities of gait and mobility (R26.89);Muscle weakness (generalized) (M62.81);Difficulty in walking, not elsewhere classified (R26.2);Dizziness and giddiness (R42);Pain Pain - part of body:  (back)     Time: 0867-6195 PT Time Calculation (min) (ACUTE ONLY): 27 min  Charges:  $Gait Training: 8-22 mins $Self Care/Home Management: 8-22                     Vedika Dumlao B. Beverely Risen PT, DPT Acute Rehabilitation Services Please use secure chat or  Call Office [phone]    Elon Alas [facility] 02/13/2022, 2:11 PM

## 2022-02-13 NOTE — Anesthesia Postprocedure Evaluation (Signed)
Anesthesia Post Note  Patient: Grant Hayden  Procedure(s) Performed: RIGHT-SIDED TRANSFORAMINAL LUMBAR INTERBODY FUSION LUMBAR 3- LUMBAR 4, LUMBAR 4- LUMBAR 5, LUMBAR 5- SACRUM 1 WITH INSTRUMENTATION AND ALLOGRAFT (Right: Spine Lumbar)     Patient location during evaluation: PACU Anesthesia Type: General Level of consciousness: awake and alert Pain management: pain level controlled Vital Signs Assessment: post-procedure vital signs reviewed and stable Respiratory status: spontaneous breathing, nonlabored ventilation, respiratory function stable and patient connected to nasal cannula oxygen Cardiovascular status: blood pressure returned to baseline and stable Postop Assessment: no apparent nausea or vomiting Anesthetic complications: no   No notable events documented.  Last Vitals:  Vitals:   02/12/22 2100 02/13/22 0353  BP:  101/67  Pulse: 70 73  Resp: 18 18  Temp:  36.5 C  SpO2: 99% 97%    Last Pain:  Vitals:   02/13/22 0429  TempSrc:   PainSc: Asleep                 Jaydin Boniface S

## 2022-02-13 NOTE — TOC Initial Note (Addendum)
Transition of Care [facility]) - Initial/Assessment Note    Patient Details  Name: Grant Hayden MRN: [id number] Date of Birth: 08-16-1951  Transition [facility] CM/SW Contact:    Ninfa Meeker, RN Phone Number: 02/13/2022, 3:55 PM  Clinical Narrative:  Case Manager spoke with patient's wife concerning recommendation for Home Health therapy. They have no preference and deferred to Case Manager to arrange [facility] with highly rated agency. Referral called to Gevena Barre, Liaison with [facility]. They are out of network with [employer]. Referral called to Marjory Lies, Liaison with [facility]. He will contact office that services patient's area, [facility], and let me know if the can staff .  [place] notified CM that [facility] will be able to provide HHPT/OT for Patient.    Expected Discharge Plan: [place] Barriers to Discharge: Continued Medical Work up   Patient Goals and CMS Choice     Choice offered to / list presented to : Spouse  Expected Discharge Plan and Services Expected Discharge Plan: [place]   Discharge Planning Services: CM Consult Post Acute Care Choice: Home Health, Durable Medical Equipment Living arrangements for the past 2 months: Single Family Home Expected Discharge Date: 02/14/22               DME Arranged: Gilford Rile rolling DME Agency: [employer]           Date HH Agency Contacted: 02/13/22 Time [place]: [id number] Representative spoke with at [place]: [place] Arrangements/Services Living arrangements for the past 2 months: [place] with:: Spouse   Do you feel safe going back to the place where you live?: Yes      Need for Family Participation in Patient Care: Yes (Comment) Care giver support system in place?: Yes (comment)   Criminal Activity/Legal Involvement Pertinent to Current Situation/Hospitalization: No - Comment as needed  Activities of  Daily Living      Permission Sought/Granted                  Emotional Assessment       Orientation: : Oriented to Self, Oriented to Place, Oriented to  Time, Oriented to Situation Alcohol / Substance Use: Not Applicable Psych Involvement: No (comment)  Admission diagnosis:  Spinal stenosis [M48.00] Patient Active Problem List   Diagnosis Date Noted   Spinal stenosis 02/12/2022   Myelopathy ([place]) 07/21/2019   PCP:  Dellia Beckwith, PA-C Pharmacy:   [place], [place] [address] [place] [address] Phone: [phone] Fax: [phone]     Social Determinants of Health (SDOH) Interventions    Readmission Risk Interventions     No data to display

## 2022-02-13 NOTE — Progress Notes (Signed)
Orthopedic Tech Progress Note Patient Details:  Grant Hayden June 25, 1951 [id number]  Patient ID: Grant Hayden, male   DOB: 1951-02-08, 71 y.o.   MRN: [id number] Called order into [employer]. Trinna Post 02/13/2022, 9:17 AM

## 2022-02-13 NOTE — Evaluation (Signed)
Physical Therapy Evaluation Patient Details Name: Grant Hayden MRN: [id number] DOB: 1950-08-21 Today's Date: 02/13/2022  History of Present Illness  71 y.o. male who presents with progressive weakness and numbness in the right leg. s/p R transforaminal lumbar interbody fusion L3-S1  Clinical Impression  PTA pt living with wife in two story home with bed and bath on first floor with 3 steps to enter. Pt reports R foot drop, but he has been independent in ambulation without AD and independence with ADLs and iADLS. Pt reports feeling better but has not had return of R foot dorsiflexion. Pt reports that he was fitted for a custom AFO but was waiting to see results of surgery. PT encourages order of custom AFO for safety with recovery. Provided Ace wrapping for increased dorsiflexion until AFO made. Pt is currently limited in safe mobility by nausea and "woozieness" in presence of decreased strength, balance and endurance. Pt is currently min A for bed mobility, min guard for transfers and min guard for ambulation with RW. PT recommending HHPT and will continue to follow pt acutely.        Recommendations for follow up therapy are one component of a multi-disciplinary discharge planning process, led by the attending physician.  Recommendations may be updated based on patient status, additional functional criteria and insurance authorization.  Follow Up Recommendations Home health PT      Assistance Recommended at Discharge Intermittent Supervision/Assistance  Patient can return home with the following  A little help with walking and/or transfers;A little help with bathing/dressing/bathroom;Assistance with cooking/housework;Assist for transportation;Help with stairs or ramp for entrance    Equipment Recommendations Rolling walker (2 wheels)  Recommendations for Other Services  OT consult    Functional Status Assessment Patient has had a recent decline in their functional status and demonstrates  the ability to make significant improvements in function in a reasonable and predictable amount of time.     Precautions / Restrictions Precautions Precautions: Back Precaution Booklet Issued: Yes (comment) Precaution Comments: able to recall 2/3 precautions at end of session, J-P drain Required Braces or Orthoses: Spinal Brace Spinal Brace: Thoracolumbosacral orthotic;Applied in sitting position Restrictions Weight Bearing Restrictions: No      Mobility  Bed Mobility Overal bed mobility: Needs Assistance Bed Mobility: Rolling, Sidelying to Sit Rolling: Min guard Sidelying to sit: Min assist       General bed mobility comments: min guard and maximal cuing for hip and shoulder moving together, good management of LE off bed min A for bringing trunk to upright    Transfers Overall transfer level: Needs assistance Equipment used: Rolling walker (2 wheels) Transfers: Sit to/from Stand Sit to Stand: Min guard           General transfer comment: min guard for power up and steadying, vc for hand placement    Ambulation/Gait Ambulation/Gait assistance: Min guard Gait Distance (Feet): 50 Feet Assistive device: Rolling walker (2 wheels) Gait Pattern/deviations: Decreased dorsiflexion - right, Step-through pattern, Step-to pattern, Antalgic, Shuffle Gait velocity: slowed Gait velocity interpretation: <1.31 ft/sec, indicative of household ambulator   General Gait Details: min guard for short distance ambulation, distance limited by nausea, and "woozieness"      Balance Overall balance assessment: Needs assistance Sitting-balance support: Feet supported, No upper extremity supported, Feet unsupported Sitting balance-Leahy Scale: Good     Standing balance support: Bilateral upper extremity supported, Single extremity supported, During functional activity Standing balance-Leahy Scale: Fair  Pertinent Vitals/Pain Pain  Assessment Pain Assessment: 0-10 Pain Score: 2  Pain Location: back incision site Pain Descriptors / Indicators: Aching, Sore Pain Intervention(s): Limited activity within patient's tolerance, Monitored during session, Repositioned, Premedicated before session    Home Living Family/patient expects to be discharged to:: Private residence Living Arrangements: Spouse/significant other Available Help at Discharge: Family;Available 24 hours/day Type of Home: House Home Access: Stairs to enter       Home Layout: One level Home Equipment: Teacher, English as a foreign language (2 wheels)      Prior Function Prior Level of Function : Independent/Modified Independent             Mobility Comments: ambulating without AD, increasing R foot drop ADLs Comments: independent        Extremity/Trunk Assessment   Upper Extremity Assessment Upper Extremity Assessment: Defer to OT evaluation    Lower Extremity Assessment Lower Extremity Assessment: RLE deficits/detail;LLE deficits/detail RLE Deficits / Details: PROM hip, knee and ankle, hip flex, knee flex, ext, and ankle plantar flex strength 3+/5, ankle dorsiflexion strength 2+/5 RLE Sensation: decreased light touch (plantar surface of the foot) LLE Deficits / Details: AROM full, strength grossly 4/5 LLE Sensation: decreased light touch (plantar surface of foot)       Communication   Communication: No difficulties  Cognition Arousal/Alertness: Lethargic Behavior During Therapy: Flat affect Overall Cognitive Status: Impaired/Different from baseline Area of Impairment: Problem solving                             Problem Solving: Slow processing General Comments: pt with increased effects of anesthesia, processing slowed        General Comments General comments (skin integrity, edema, etc.): wife in room during session, pt already fitted for custom AFO but does not have order placed for insurance coverage, MD to provide order,  VSS on RA        Assessment/Plan    PT Assessment Patient needs continued PT services  PT Problem List Decreased strength;Decreased range of motion;Decreased activity tolerance;Decreased balance;Decreased mobility;Decreased coordination;Impaired sensation;Pain       PT Treatment Interventions DME instruction;Gait training;Functional mobility training;Therapeutic activities;Stair training;Therapeutic exercise;Balance training;Cognitive remediation;Patient/family education    PT Goals (Current goals can be found in the Care Plan section)  Acute Rehab PT Goals Patient Stated Goal: walk daughter down the aisle this fall PT Goal Formulation: With patient/family Time For Goal Achievement: 02/28/22 Potential to Achieve Goals: Good    Frequency Min 4X/week        AM-PAC PT "6 Clicks" Mobility  Outcome Measure Help needed turning from your back to your side while in a flat bed without using bedrails?: None Help needed moving from lying on your back to sitting on the side of a flat bed without using bedrails?: A Little Help needed moving to and from a bed to a chair (including a wheelchair)?: A Little Help needed standing up from a chair using your arms (e.g., wheelchair or bedside chair)?: A Little Help needed to walk in hospital room?: A Little Help needed climbing 3-5 steps with a railing? : A Lot 6 Click Score: 18    End of Session Equipment Utilized During Treatment: Back brace Activity Tolerance: Treatment limited secondary to medical complications (Comment);Patient limited by lethargy (nausea) Patient left: in chair;with call bell/phone within reach;with family/visitor present;with nursing/sitter in room Nurse Communication: Mobility status;Other (comment) (need for custom AFO order) PT Visit Diagnosis: Unsteadiness on feet (R26.81);Other abnormalities of gait  and mobility (R26.89);Muscle weakness (generalized) (M62.81);Difficulty in walking, not elsewhere classified  (R26.2);Dizziness and giddiness (R42);Pain Pain - part of body:  (back)    Time: 1610-9604 PT Time Calculation (min) (ACUTE ONLY): 42 min   Charges:   PT Evaluation $PT Eval Moderate Complexity: 1 Mod PT Treatments $Therapeutic Activity: 8-22 mins $Self Care/Home Management: 8-22        Decoda Van B. Beverely Risen PT, DPT Acute Rehabilitation Services Please use secure chat or  Call Office [phone]   Elon Alas [facility] 02/13/2022, 9:21 AM

## 2022-02-13 NOTE — Evaluation (Signed)
Occupational Therapy Evaluation Patient Details Name: Grant Hayden MRN: [id number] DOB: 16-Mar-1951 Today's Date: 02/13/2022   History of Present Illness 71 y.o. male who presents with progressive weakness and numbness in the right leg. s/p R transforaminal lumbar interbody fusion L3-S1   Clinical Impression   PTA, pt was living with his wife and was independent. Currently, pt requires Min Guard-Min A for LB ADLs and Min Guard A for functional mobility using RW. Provided education and handout on back precautions, grooming, brace management, LB ADLs with AE, toileting with AE, and shower transfer with RW; pt demonstrated understanding. Answered all pt questions. Pt would benefit from further acute OT to facilitate safe dc. Recommend dc to home with HHOT for further OT to optimize safety, independence with ADLs, and return to PLOF.      Recommendations for follow up therapy are one component of a multi-disciplinary discharge planning process, led by the attending physician.  Recommendations may be updated based on patient status, additional functional criteria and insurance authorization.   Follow Up Recommendations  Home health OT    Assistance Recommended at Discharge Frequent or constant Supervision/Assistance  Patient can return home with the following      Functional Status Assessment  Patient has had a recent decline in their functional status and demonstrates the ability to make significant improvements in function in a reasonable and predictable amount of time.  Equipment Recommendations  Other (comment) (RW)    Recommendations for Other Services PT consult     Precautions / Restrictions Precautions Precautions: Back Precaution Booklet Issued: Yes (comment) Precaution Comments: Reviewed back precautions, J-P drain Required Braces or Orthoses: Spinal Brace Spinal Brace: Thoracolumbosacral orthotic;Applied in sitting position Restrictions Weight Bearing Restrictions: No       Mobility Bed Mobility Overal bed mobility: Needs Assistance Bed Mobility: Rolling, Sit to Sidelying Rolling: Min guard       Sit to sidelying: Min assist General bed mobility comments: Assist with LE management    Transfers Overall transfer level: Needs assistance Equipment used: Rolling walker (2 wheels) Transfers: Sit to/from Stand Sit to Stand: Min guard           General transfer comment: Min Guard A for safety. Cues for reaching back      Balance Overall balance assessment: Needs assistance Sitting-balance support: Feet supported, No upper extremity supported, Feet unsupported Sitting balance-Leahy Scale: Good     Standing balance support: Bilateral upper extremity supported, Single extremity supported, During functional activity Standing balance-Leahy Scale: Fair                             ADL either performed or assessed with clinical judgement   ADL Overall ADL's : Needs assistance/impaired Eating/Feeding: Set up;Sitting   Grooming: Set up;Sitting   Upper Body Bathing: Supervision/ safety;Set up;Sitting   Lower Body Bathing: Min guard;Sit to/from stand   Upper Body Dressing : Set up;Minimal assistance;Sitting Upper Body Dressing Details (indicate cue type and reason): Education on brace management. donning shirt and brace Lower Body Dressing: Minimal assistance;Sit to/from stand;With adaptive equipment Lower Body Dressing Details (indicate cue type and reason): Min A for managing reacher for donning underwear and pants. When using figure four, pt with posterior lean and poor balance. Toilet Transfer: Min guard;Ambulation;Rolling walker (2 wheels) (simulated to recliner)     Toileting - Clothing Manipulation Details (indicate cue type and reason): Educaitng pt and family on compensatory techniques and AE for toilet hygiene. Both  verbalized understanding Tub/ Shower Transfer: Min guard;Walk-in shower;Ambulation;Rolling walker (2 wheels)    Functional mobility during ADLs: Min guard;Rolling walker (2 wheels) General ADL Comments: Providing education on beck precautions, brace management, grooming, LB ADLs with AE, toileting with AE, and shower transfer with RW.     Vision         Perception     Praxis      Pertinent Vitals/Pain Pain Assessment Pain Assessment: Faces Faces Pain Scale: Hurts little more Pain Location: back incision site Pain Descriptors / Indicators: Aching, Sore Pain Intervention(s): Monitored during session, Limited activity within patient's tolerance, Repositioned     Hand Dominance     Extremity/Trunk Assessment Upper Extremity Assessment Upper Extremity Assessment: LUE deficits/detail LUE Deficits / Details: Reports decreased dexerity at L hand at baseline LUE Coordination: decreased fine motor   Lower Extremity Assessment Lower Extremity Assessment: Defer to PT evaluation RLE Deficits / Details: PROM hip, knee and ankle, hip flex, knee flex, ext, and ankle plantar flex strength 3+/5, ankle dorsiflexion strength 2+/5 RLE Sensation: decreased light touch (plantar surface of the foot) LLE Deficits / Details: AROM full, strength grossly 4/5 LLE Sensation: decreased light touch (plantar surface of foot)   Cervical / Trunk Assessment Cervical / Trunk Assessment: Back Surgery   Communication Communication Communication: No difficulties   Cognition Arousal/Alertness: Awake/alert (Initially awake; by end of session, closing eyes more with fatigue) Behavior During Therapy: Flat affect Overall Cognitive Status: Impaired/Different from baseline Area of Impairment: Problem solving                             Problem Solving: Slow processing General Comments: Requiring increased time. Cues for problem solving with AE     General Comments  Wife present throughout session    Exercises     Shoulder Instructions      Home Living Family/patient expects to be discharged to::  Private residence Living Arrangements: Spouse/significant other Available Help at Discharge: Family;Available 24 hours/day Type of Home: House Home Access: Stairs to enter   Entrance Stairs-Rails: Left Home Layout: One level     Bathroom Shower/Tub: Walk-in shower;Tub/shower unit   Bathroom Toilet: Standard     Home Equipment: Teacher, English as a foreign language (2 wheels);Toilet riser          Prior Functioning/Environment Prior Level of Function : Independent/Modified Independent             Mobility Comments: ambulating without AD, increasing R foot drop ADLs Comments: independent        OT Problem List: Decreased strength;Decreased range of motion;Decreased activity tolerance;Decreased knowledge of use of DME or AE;Decreased knowledge of precautions;Pain      OT Treatment/Interventions: Self-care/ADL training;Therapeutic exercise;Energy conservation;DME and/or AE instruction;Therapeutic activities;Patient/family education    OT Goals(Current goals can be found in the care plan section) Acute Rehab OT Goals Patient Stated Goal: Go home OT Goal Formulation: With patient Time For Goal Achievement: 02/27/22 Potential to Achieve Goals: Good  OT Frequency: Min 2X/week    Co-evaluation              AM-PAC OT "6 Clicks" Daily Activity     Outcome Measure Help from another person eating meals?: None Help from another person taking care of personal grooming?: A Little Help from another person toileting, which includes using toliet, bedpan, or urinal?: A Little Help from another person bathing (including washing, rinsing, drying)?: A Little Help from another person to put on and taking  off regular upper body clothing?: A Little Help from another person to put on and taking off regular lower body clothing?: A Little 6 Click Score: 19   End of Session Equipment Utilized During Treatment: Rolling walker (2 wheels);Gait belt;Back brace Nurse Communication: Mobility  status  Activity Tolerance: Patient tolerated treatment well Patient left: in chair;with call bell/phone within reach  OT Visit Diagnosis: Unsteadiness on feet (R26.81);Other abnormalities of gait and mobility (R26.89);Muscle weakness (generalized) (M62.81)                Time: 1104-1140 OT Time Calculation (min): 36 min Charges:  OT General Charges $OT Visit: 1 Visit OT Evaluation $OT Eval Low Complexity: 1 Low OT Treatments $Self Care/Home Management : 8-22 mins  Chaise Mahabir MSOT, OTR/L Acute Rehab Office: [phone]  Theodoro Grist Jashua Knaak 02/13/2022, 12:30 PM

## 2022-02-13 NOTE — Addendum Note (Signed)
Addendum  created 02/13/22 1909 by Adair Laundry, CRNA   Charge Capture section accepted

## 2022-02-14 DIAGNOSIS — M48062 Spinal stenosis, lumbar region with neurogenic claudication: Secondary | ICD-10-CM | POA: Diagnosis not present

## 2022-02-14 NOTE — Progress Notes (Signed)
    Patient doing well status post lumbar decompression and fusion.  His drain was removed after minimal output.  He has been doing well this morning.  He denies any pain at this venture.  He has been up with therapy.  He is wearing his brace and following her restrictions.  He does find the Percocet to be sedating  Physical Exam: Vitals:   02/14/22 0459 02/14/22 0851  BP: 109/72 104/70  Pulse: 75 75  Resp: 20 16  Temp: 97.7 F (36.5 C) 98.3 F (36.8 C)  SpO2: 99% 95%    Dressing in place, clean dry and intact.  The drain was removed with only 15 of output.  He is resting comfortably in his TLSO brace sitting in a recliner.  Distal compartments are soft.  Neurovascularly intact.  Normal mood and affect. NVI  POD #2 s/p lumbar decompression and fusion.  Patient doing well with minimal low back pain well controlled at this time.  Drain was removed without difficulty.  - up with PT/OT, encourage ambulation - Percocet for pain, Robaxin for muscle spasms  Patient can transition to extra strength Tylenol as desired for less sedation - d/c home today with f/u in 2 weeks

## 2022-02-14 NOTE — TOC Transition Note (Signed)
Transition of Care [facility]) - CM/SW Discharge Note   Patient Details  Name: Grant Hayden MRN: [id number] Date of Birth: 1951-01-24  Transition of [facility]) CM/SW Contact:  Baldemar Lenis, LCSW Phone Number: 02/14/2022, 10:22 AM   Clinical Narrative:   Patient set up with [facility] for home health, no other needs identified.    Final next level of care: Home w Home Health Services Barriers to Discharge: Barriers Resolved   Patient Goals and CMS Choice     Choice offered to / list presented to : Spouse  Discharge Placement                       Discharge Plan and Services   Discharge Planning Services: CM Consult Post Acute Care Choice: Home Health, Durable Medical Equipment          DME Arranged: Walker rolling DME Agency: [facility]           Date HH Agency Contacted: 02/13/22 Time HH Agency Contacted: 1545 Representative spoke with at [facility] Agency: Amy Hyatt  Social Determinants of Health (SDOH) Interventions     Readmission Risk Interventions     No data to display

## 2022-02-14 NOTE — Progress Notes (Signed)
OT Cancellation Note  Patient Details Name: Grant Hayden MRN: [id number] DOB: July 02, 1951   Cancelled Treatment:    Reason Eval/Treat Not Completed: Other (comment) (Checking to see if patient and wife had any questions. Reporting they feel comfortable with ADLs.)  [name] [name] [name] MSOT, OTR/L Acute Rehab Office: [phone] 02/14/2022, 9:02 AM

## 2022-02-14 NOTE — Plan of Care (Signed)
Pt doing well. Pt and wife given D/C instructions with verbal understanding. Rx's were sent to the pharmacy by MD. Pt's incision is clean and dry with no sign of infection. Pt's IV was removed prior to D/C. Pt received RW from [facility] per MD order. Pt's Home Health was arranged per MD order. Pt D/C'd home via wheelchair and is stable @ D/C and has no other needs at this time. Rema Fendt, RN

## 2022-02-14 NOTE — Progress Notes (Signed)
Physical Therapy Treatment Patient Details Name: Grant Hayden MRN: [id number] DOB: 02-Feb-1951 Today's Date: 02/14/2022   History of Present Illness 71 y.o. male who presents with progressive weakness and numbness in the right leg. s/p R transforaminal lumbar interbody fusion L3-S1    PT Comments    Pt feeling much less lethargic today, able to recall all of his precautions. Pt continues to have weakness and heaviness in R LE. Discussed more R LE muscle activation as a result of spinal surgery which results in more muscle fatigue. Pt and wife with questions about return to bed. Provide verbal and tactile cuing and pt able to achieve with less assist and less twist. Also, educated on car transfers. Plan for discharge today, pt and wife both report feeling more comfortable with discharge today.      Recommendations for follow up therapy are one component of a multi-disciplinary discharge planning process, led by the attending physician.  Recommendations may be updated based on patient status, additional functional criteria and insurance authorization.  Follow Up Recommendations  Home health PT     Assistance Recommended at Discharge Intermittent Supervision/Assistance  Patient can return home with the following A little help with walking and/or transfers;A little help with bathing/dressing/bathroom;Assistance with cooking/housework;Assist for transportation;Help with stairs or ramp for entrance   Equipment Recommendations  Rolling walker (2 wheels)       Precautions / Restrictions Precautions Precautions: Back Precaution Booklet Issued: Yes (comment) Precaution Comments: able to recall 3/3 precautions Required Braces or Orthoses: Spinal Brace Spinal Brace: Thoracolumbosacral orthotic;Applied in sitting position Restrictions Weight Bearing Restrictions: No     Mobility  Bed Mobility Overal bed mobility: Needs Assistance Bed Mobility: Rolling, Sidelying to Sit Rolling:  Supervision Sidelying to sit: Supervision     Sit to sidelying: Min guard General bed mobility comments: supervision for getting out of bed, min guard to get back in bed, requires increased cuing for coming into sidelying before rolling back in bed to decreased twisting                        Balance Overall balance assessment: Needs assistance Sitting-balance support: Feet supported, No upper extremity supported, Feet unsupported Sitting balance-Leahy Scale: Good                                      Cognition Arousal/Alertness: Awake/alert Behavior During Therapy: Flat affect Overall Cognitive Status: Within Functional Limits for tasks assessed                                             General Comments General comments (skin integrity, edema, etc.): provided education on car transfers, reviewed stair sequencing and discussed getting AFO      Pertinent Vitals/Pain Pain Assessment Pain Assessment: Faces Faces Pain Scale: Hurts little more Pain Location: back incision site Pain Descriptors / Indicators: Aching, Sore Pain Intervention(s): Limited activity within patient's tolerance, Monitored during session, Premedicated before session, Repositioned     PT Goals (current goals can now be found in the care plan section) Acute Rehab PT Goals Patient Stated Goal: walk daughter down the aisle this fall PT Goal Formulation: With patient/family Time For Goal Achievement: 02/28/22 Potential to Achieve Goals: Good Progress towards PT goals: Progressing toward goals  Frequency    Min 4X/week      PT Plan Current plan remains appropriate       AM-PAC PT "6 Clicks" Mobility   Outcome Measure  Help needed turning from your back to your side while in a flat bed without using bedrails?: None Help needed moving from lying on your back to sitting on the side of a flat bed without using bedrails?: None Help needed moving to and  from a bed to a chair (including a wheelchair)?: A Little Help needed standing up from a chair using your arms (e.g., wheelchair or bedside chair)?: A Little Help needed to walk in hospital room?: A Little Help needed climbing 3-5 steps with a railing? : A Lot 6 Click Score: 19    End of Session   Activity Tolerance: Patient tolerated treatment well Patient left: in bed;with call bell/phone within reach;with family/visitor present Nurse Communication: Mobility status PT Visit Diagnosis: Unsteadiness on feet (R26.81);Other abnormalities of gait and mobility (R26.89);Muscle weakness (generalized) (M62.81);Difficulty in walking, not elsewhere classified (R26.2);Dizziness and giddiness (R42);Pain Pain - part of body:  (back)     Time: 0813-8871 PT Time Calculation (min) (ACUTE ONLY): 17 min  Charges:  $Therapeutic Activity: 8-22 mins                     Grant Hayden Grant Hayden PT, DPT Acute Rehabilitation Services Please use secure chat or  Call Office [phone]    Grant Hayden 02/14/2022, 10:13 AM

## 2022-02-21 MED FILL — Sodium Chloride IV Soln 0.9%: INTRAVENOUS | Qty: 1000 | Status: AC

## 2022-02-21 MED FILL — Sodium Chloride Irrigation Soln 0.9%: Qty: 3000 | Status: AC

## 2022-02-21 MED FILL — Heparin Sodium (Porcine) Inj 1000 Unit/ML: INTRAMUSCULAR | Qty: 30 | Status: AC

## 2022-02-26 NOTE — Discharge Summary (Signed)
Patient ID: Grant Hayden MRN: [id number] DOB/AGE: 1950/11/23 71 y.o.  Admit date: 02/12/2022 Discharge date: 02/14/2022  Admission Diagnoses:  Principal Problem:   Spinal stenosis   Discharge Diagnoses:  Same  Past Medical History:  Diagnosis Date   Arthritis    hands, neck   Carpal tunnel syndrome    bilateral   Coronary artery disease    Dyspnea    Dysrhythmia    a-fib/a-flutter in post op after 12/21/18 MV replacement surgery, PVCs    Heart murmur    History of blood transfusion    [facility]   Insomnia    Mitral valve regurgitation congenital    Neuromuscular disorder (HCC)    neuropathy hands/feet   RLS (restless legs syndrome)    Sleep apnea     Surgeries: Procedure(s): RIGHT-SIDED TRANSFORAMINAL LUMBAR INTERBODY FUSION LUMBAR 3- LUMBAR 4, LUMBAR 4- LUMBAR 5, LUMBAR 5- SACRUM 1 WITH INSTRUMENTATION AND ALLOGRAFT on 02/12/2022   Consultants: none  Discharged Condition: Improved  Hospital Course: Grant Hayden is an 71 y.o. male who was admitted 02/12/2022 for operative treatment of Spinal stenosis. Patient has severe unremitting pain that affects sleep, daily activities, and work/hobbies. After pre-op clearance the patient was taken to the operating room on 02/12/2022 and underwent  Procedure(s): RIGHT-SIDED TRANSFORAMINAL LUMBAR INTERBODY FUSION LUMBAR 3- LUMBAR 4, LUMBAR 4- LUMBAR 5, LUMBAR 5- SACRUM 1 WITH INSTRUMENTATION AND ALLOGRAFT.    Patient was given perioperative antibiotics:  Anti-infectives (From admission, onward)    Start     Dose/Rate Route Frequency Ordered Stop   02/12/22 2000  ceFAZolin (ANCEF) IVPB 2g/100 mL premix        2 g 200 mL/hr over 30 Minutes Intravenous Every 8 hours 02/12/22 1627 02/13/22 0414   02/12/22 0600  ceFAZolin (ANCEF) IVPB 2g/100 mL premix        2 g 200 mL/hr over 30 Minutes Intravenous On call to O.R. 02/12/22 0551 02/12/22 1200   02/12/22 0554  ceFAZolin (ANCEF) 2-4 GM/100ML-% IVPB       Note to Pharmacy: Payton Emerald  A: cabinet override      02/12/22 0554 02/12/22 0806        Patient was given sequential compression devices, early ambulation to prevent DVT.  Patient benefited maximally from hospital stay and there were no complications.    Recent vital signs: BP 104/70 (BP Location: Left Arm)   Pulse 75   Temp 98.3 F (36.8 C) (Oral)   Resp 16   Ht 5\' 10"  (1.778 m)   Wt 83 kg   SpO2 95%   BMI 26.26 kg/m    Discharge Medications:   Allergies as of 02/14/2022   No Known Allergies      Medication List     TAKE these medications    albuterol 108 (90 Base) MCG/ACT inhaler Commonly known as: VENTOLIN HFA Inhale into the lungs every 6 (six) hours as needed for wheezing or shortness of breath.   aspirin EC 81 MG tablet Take 81 mg by mouth every morning.   dofetilide 250 MCG capsule Commonly known as: TIKOSYN Take 250 mcg by mouth 2 (two) times daily.   levalbuterol 45 MCG/ACT inhaler Commonly known as: XOPENEX HFA Inhale 2 puffs into the lungs every 4 (four) hours as needed for wheezing (Coughing).   MAGNESIUM-ZINC PO Take 225 mg by mouth at bedtime. With vitamin D3   methocarbamol 500 MG tablet Commonly known as: ROBAXIN Take 1 tablet (500 mg total) by mouth every 6 (six)  hours as needed for muscle spasms.   oxyCODONE-acetaminophen 5-325 MG tablet Commonly known as: PERCOCET/ROXICET Take 1-2 tablets by mouth every 4 (four) hours as needed for moderate pain or severe pain.   pramipexole 1.5 MG tablet Commonly known as: MIRAPEX Take 1.5 mg by mouth at bedtime.   tadalafil 5 MG tablet Commonly known as: CIALIS Take 5 mg by mouth daily as needed.   zolpidem 10 MG tablet Commonly known as: AMBIEN Take 5 mg by mouth at bedtime as needed for sleep.        Diagnostic Studies: DG Lumbar Spine 2-3 Views  Result Date: 02/12/2022 CLINICAL DATA:  Right-sided transforaminal lumbar interbody fusion L3-L4, L4-L5, L5-S1. EXAM: LUMBAR SPINE - 2-3 VIEW COMPARISON:  Lumbar spine  x-ray 02/12/2022 FINDINGS: Intraoperative lumbar spine. Two low resolution intraoperative spot views of the lumbar spine were obtained. No fracture visible on the limited views. L3-S1 posterior fusion hardware and disc spacers are present. Alignment is anatomic. Total fluoroscopy time: 2 minutes 21 seconds Total radiation dose: 74.71 micro Gy IMPRESSION: Intraoperative L3-S1 posterior fusion. Electronically Signed   By: Darliss Cheney M.D.   On: 02/12/2022 15:25   DG C-Arm 1-60 Min-No Report  Result Date: 02/12/2022 Fluoroscopy was utilized by the requesting physician.  No radiographic interpretation.   DG C-Arm 1-60 Min-No Report  Result Date: 02/12/2022 Fluoroscopy was utilized by the requesting physician.  No radiographic interpretation.   DG C-Arm 1-60 Min-No Report  Result Date: 02/12/2022 Fluoroscopy was utilized by the requesting physician.  No radiographic interpretation.   DG C-Arm 1-60 Min-No Report  Result Date: 02/12/2022 Fluoroscopy was utilized by the requesting physician.  No radiographic interpretation.   DG C-Arm 1-60 Min-No Report  Result Date: 02/12/2022 Fluoroscopy was utilized by the requesting physician.  No radiographic interpretation.   DG C-Arm 1-60 Min-No Report  Result Date: 02/12/2022 Fluoroscopy was utilized by the requesting physician.  No radiographic interpretation.   DG Lumbar Spine 1 View  Result Date: 02/12/2022 CLINICAL DATA:  Needle localization EXAM: LUMBAR SPINE - 1 VIEW COMPARISON:  None Available. FINDINGS: Lateral intraoperative radiograph centered at the lumbosacral junction, time stamp 8:06:38. There is a metallic needle localizer at the level of S2. there is severe multilevel degenerative disc disease. IMPRESSION: Metallic needle localizer is at the level of S2. Electronically Signed   By: Caprice Renshaw M.D.   On: 02/12/2022 10:28    Disposition: Discharge disposition: 01-Home or Self Care       Discharge Instructions     Discharge  patient   Complete by: As directed    Discharge disposition: 01-Home or Self Care   Discharge patient date: 02/14/2022      POD #2 s/p lumbar decompression and fusion.  Patient doing well with minimal low back pain well controlled at this time.  Drain was removed without difficulty.   - up with PT/OT, encourage ambulation - Percocet for pain, Robaxin for muscle spasms             Patient can transition to extra strength Tylenol as desired for less sedation -Scripts for pain sent to pharmacy electronically  -D/C instructions sheet printed and in chart -D/C today  -F/U in office 2 weeks   Signed: Georga Bora 02/26/2022, 2:56 PM
